# Patient Record
Sex: Female | Born: 1955 | Race: White | Hispanic: No | Marital: Married | Smoking: Never smoker
Health system: Southern US, Community
[De-identification: ages and names within clinical notes are randomized; demographics above are authoritative.]

## PROBLEM LIST (undated history)

## (undated) DIAGNOSIS — T8859XA Other complications of anesthesia, initial encounter: Secondary | ICD-10-CM

## (undated) DIAGNOSIS — M8580 Other specified disorders of bone density and structure, unspecified site: Secondary | ICD-10-CM

## (undated) DIAGNOSIS — R112 Nausea with vomiting, unspecified: Secondary | ICD-10-CM

## (undated) DIAGNOSIS — M1990 Unspecified osteoarthritis, unspecified site: Secondary | ICD-10-CM

## (undated) DIAGNOSIS — Z8489 Family history of other specified conditions: Secondary | ICD-10-CM

## (undated) DIAGNOSIS — N200 Calculus of kidney: Secondary | ICD-10-CM

## (undated) DIAGNOSIS — E785 Hyperlipidemia, unspecified: Secondary | ICD-10-CM

## (undated) DIAGNOSIS — R7303 Prediabetes: Secondary | ICD-10-CM

## (undated) DIAGNOSIS — Z9889 Other specified postprocedural states: Secondary | ICD-10-CM

## (undated) DIAGNOSIS — H269 Unspecified cataract: Secondary | ICD-10-CM

## (undated) DIAGNOSIS — T7840XA Allergy, unspecified, initial encounter: Secondary | ICD-10-CM

## (undated) HISTORY — PX: TONSILLECTOMY AND ADENOIDECTOMY: SUR1326

## (undated) HISTORY — DX: Unspecified cataract: H26.9

## (undated) HISTORY — PX: TOTAL HIP ARTHROPLASTY: SHX124

## (undated) HISTORY — PX: JOINT REPLACEMENT: SHX530

## (undated) HISTORY — PX: EXTRACORPOREAL SHOCK WAVE LITHOTRIPSY: SHX1557

## (undated) HISTORY — DX: Other specified disorders of bone density and structure, unspecified site: M85.80

## (undated) HISTORY — DX: Calculus of kidney: N20.0

## (undated) HISTORY — DX: Hyperlipidemia, unspecified: E78.5

## (undated) HISTORY — PX: WISDOM TOOTH EXTRACTION: SHX21

## (undated) HISTORY — DX: Unspecified osteoarthritis, unspecified site: M19.90

## (undated) HISTORY — DX: Allergy, unspecified, initial encounter: T78.40XA

---
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
## 1988-06-04 HISTORY — PX: CHOLECYSTECTOMY: SHX55

## 2004-03-31 ENCOUNTER — Ambulatory Visit: Payer: Self-pay | Admitting: Internal Medicine

## 2005-04-02 ENCOUNTER — Ambulatory Visit: Payer: Self-pay | Admitting: Internal Medicine

## 2006-07-22 ENCOUNTER — Ambulatory Visit: Payer: Self-pay | Admitting: Internal Medicine

## 2006-12-10 ENCOUNTER — Ambulatory Visit: Payer: Self-pay | Admitting: Gastroenterology

## 2006-12-12 ENCOUNTER — Ambulatory Visit: Payer: Self-pay | Admitting: Urology

## 2007-06-23 ENCOUNTER — Ambulatory Visit: Payer: Self-pay | Admitting: Urology

## 2007-10-16 ENCOUNTER — Ambulatory Visit: Payer: Self-pay | Admitting: Family Medicine

## 2007-11-17 ENCOUNTER — Ambulatory Visit: Payer: Self-pay | Admitting: Urology

## 2008-05-05 ENCOUNTER — Ambulatory Visit: Payer: Self-pay | Admitting: Urology

## 2008-10-19 ENCOUNTER — Ambulatory Visit: Payer: Self-pay | Admitting: Family Medicine

## 2008-11-24 ENCOUNTER — Ambulatory Visit: Payer: Self-pay | Admitting: Urology

## 2009-05-23 ENCOUNTER — Ambulatory Visit: Payer: Self-pay | Admitting: Urology

## 2009-11-16 ENCOUNTER — Ambulatory Visit: Payer: Self-pay | Admitting: Urology

## 2009-11-24 ENCOUNTER — Ambulatory Visit: Payer: Self-pay | Admitting: Urology

## 2009-12-08 ENCOUNTER — Ambulatory Visit: Payer: Self-pay | Admitting: Urology

## 2010-04-14 ENCOUNTER — Ambulatory Visit: Payer: Self-pay | Admitting: Urology

## 2010-12-28 ENCOUNTER — Ambulatory Visit: Payer: Self-pay | Admitting: Family Medicine

## 2011-01-15 ENCOUNTER — Ambulatory Visit: Payer: Self-pay | Admitting: Podiatry

## 2011-04-09 ENCOUNTER — Ambulatory Visit: Payer: Self-pay | Admitting: Urology

## 2011-06-24 IMAGING — CR DG ABDOMEN 1V
1 series · 2 of 2 positions shown · non-contrast
Comparison: none

REASON FOR EXAM: calculus of kidney
COMMENTS:

[Series 1: view not recorded · 0.17mm/px · 2 of 2 slices shown]
[im 1/2]
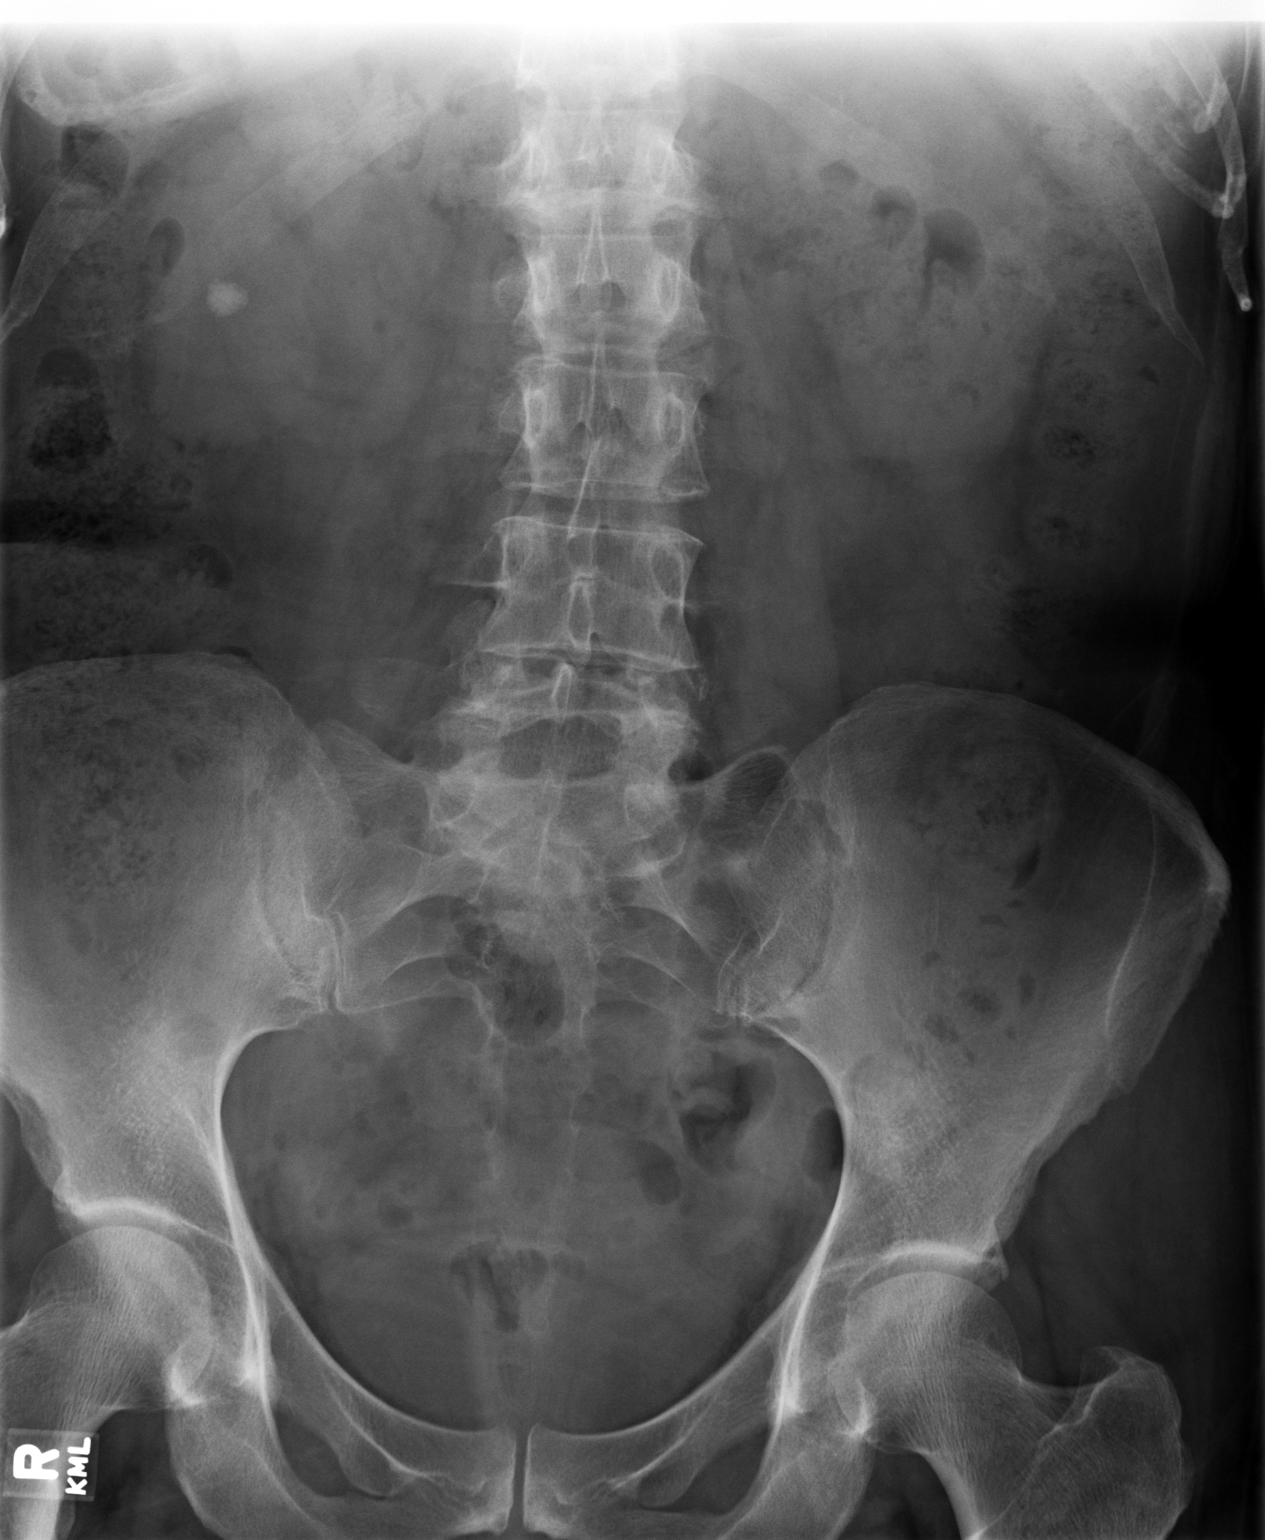
[im 2/2]
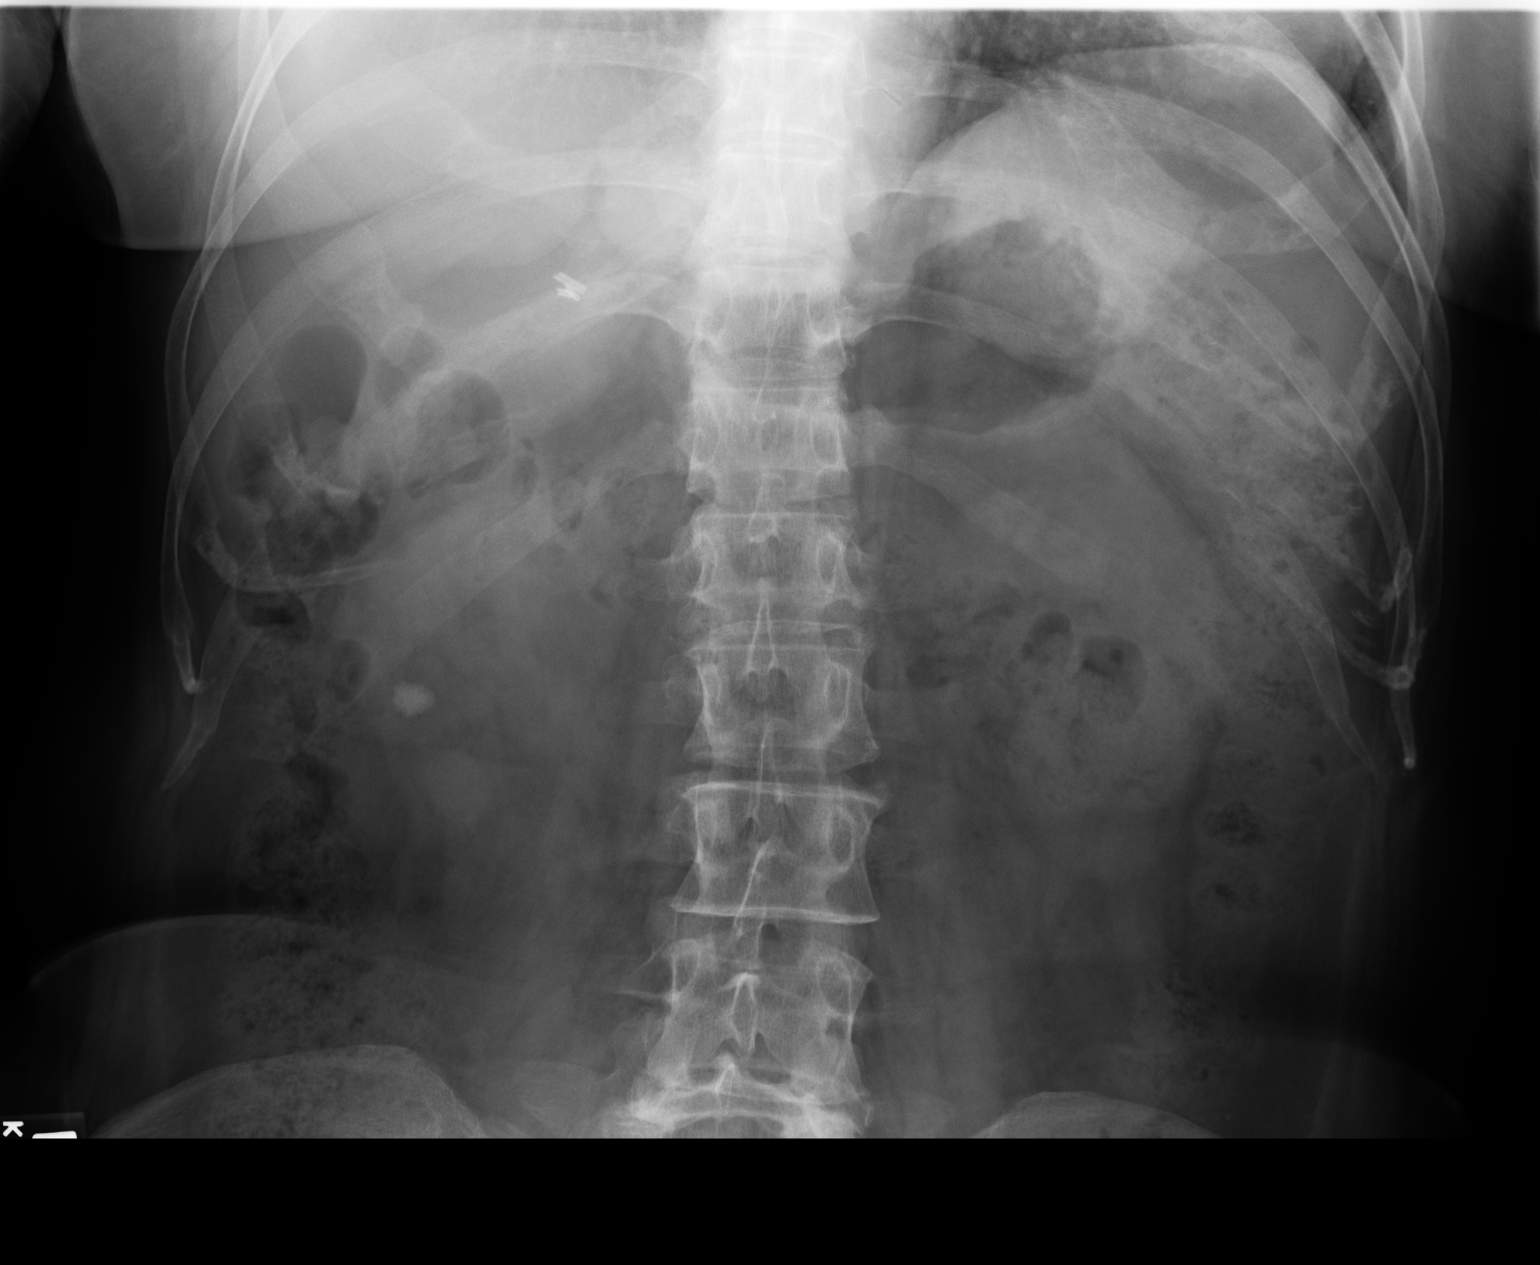

[2 of 2 positions shown; findings below may reference images not displayed]

PROCEDURE:     MDR - MDR KIDNEY URETER BLADDER  - May 23, 2009  [DATE]

RESULT:     Comparison is made to the prior exam 11/24/2008. There is again
noted an 11 mm calcification projected near the junction of the mid and
lower pole region of the right kidney. No left renal calcifications are
seen. No ureteral calcifications are observed on either side.
IMPRESSION: Right nephrolithiasis.

## 2012-04-09 ENCOUNTER — Ambulatory Visit: Payer: Self-pay | Admitting: Urology

## 2012-04-09 DIAGNOSIS — B3731 Acute candidiasis of vulva and vagina: Secondary | ICD-10-CM | POA: Insufficient documentation

## 2012-04-09 DIAGNOSIS — Z23 Encounter for immunization: Secondary | ICD-10-CM | POA: Insufficient documentation

## 2012-04-09 DIAGNOSIS — N3000 Acute cystitis without hematuria: Secondary | ICD-10-CM | POA: Insufficient documentation

## 2012-04-09 DIAGNOSIS — R339 Retention of urine, unspecified: Secondary | ICD-10-CM | POA: Insufficient documentation

## 2012-04-09 DIAGNOSIS — B373 Candidiasis of vulva and vagina: Secondary | ICD-10-CM | POA: Insufficient documentation

## 2012-11-06 ENCOUNTER — Ambulatory Visit: Payer: Self-pay | Admitting: Family Medicine

## 2013-04-14 ENCOUNTER — Ambulatory Visit: Payer: Self-pay | Admitting: Urology

## 2013-09-30 DIAGNOSIS — N200 Calculus of kidney: Secondary | ICD-10-CM | POA: Insufficient documentation

## 2013-09-30 DIAGNOSIS — E7800 Pure hypercholesterolemia, unspecified: Secondary | ICD-10-CM | POA: Insufficient documentation

## 2014-09-27 ENCOUNTER — Ambulatory Visit
Admit: 2014-09-27 | Disposition: A | Discharge status: Home / Self Care | Payer: Self-pay | Admitting: Nurse Practitioner

## 2015-08-04 ENCOUNTER — Other Ambulatory Visit: Payer: Self-pay | Admitting: Nurse Practitioner

## 2015-08-04 DIAGNOSIS — Z1231 Encounter for screening mammogram for malignant neoplasm of breast: Secondary | ICD-10-CM

## 2015-09-28 ENCOUNTER — Ambulatory Visit
Admission: RE | Admit: 2015-09-28 | Discharge: 2015-09-28 | Disposition: A | Discharge status: Home / Self Care | Payer: BC Managed Care – PPO | Source: Ambulatory Visit | Attending: Nurse Practitioner | Admitting: Nurse Practitioner

## 2015-09-28 DIAGNOSIS — Z1231 Encounter for screening mammogram for malignant neoplasm of breast: Secondary | ICD-10-CM | POA: Insufficient documentation

## 2017-08-07 ENCOUNTER — Other Ambulatory Visit: Payer: Self-pay | Admitting: Nurse Practitioner

## 2017-08-07 DIAGNOSIS — Z1231 Encounter for screening mammogram for malignant neoplasm of breast: Secondary | ICD-10-CM

## 2017-08-20 ENCOUNTER — Ambulatory Visit
Admission: RE | Admit: 2017-08-20 | Discharge: 2017-08-20 | Disposition: A | Discharge status: Home / Self Care | Payer: BC Managed Care – PPO | Source: Ambulatory Visit | Attending: Nurse Practitioner | Admitting: Nurse Practitioner

## 2017-08-20 DIAGNOSIS — Z1231 Encounter for screening mammogram for malignant neoplasm of breast: Secondary | ICD-10-CM | POA: Insufficient documentation

## 2018-09-09 ENCOUNTER — Ambulatory Visit: Payer: Self-pay | Admitting: Urology

## 2018-10-06 ENCOUNTER — Ambulatory Visit: Payer: Self-pay | Admitting: Urology

## 2018-11-25 ENCOUNTER — Ambulatory Visit: Payer: Self-pay | Admitting: Urology

## 2018-11-27 ENCOUNTER — Ambulatory Visit: Payer: Self-pay | Admitting: Urology

## 2018-12-19 ENCOUNTER — Ambulatory Visit: Payer: Self-pay | Admitting: Urology

## 2018-12-31 ENCOUNTER — Encounter: Payer: Self-pay | Admitting: Urology

## 2019-01-01 ENCOUNTER — Other Ambulatory Visit: Payer: Self-pay

## 2019-01-02 ENCOUNTER — Other Ambulatory Visit: Payer: Self-pay

## 2019-01-02 ENCOUNTER — Ambulatory Visit
Admission: RE | Admit: 2019-01-02 | Discharge: 2019-01-02 | Disposition: A | Discharge status: Home / Self Care | Payer: BC Managed Care – PPO | Source: Ambulatory Visit | Attending: Urology | Admitting: Urology

## 2019-01-02 ENCOUNTER — Encounter: Payer: Self-pay | Admitting: Urology

## 2019-01-02 ENCOUNTER — Ambulatory Visit: Payer: BC Managed Care – PPO | Admitting: Urology

## 2019-01-02 VITALS — BP 116/72 | HR 87 | Ht 69.0 in | Wt 190.0 lb

## 2019-01-02 DIAGNOSIS — N200 Calculus of kidney: Secondary | ICD-10-CM | POA: Diagnosis not present

## 2019-01-02 DIAGNOSIS — R3989 Other symptoms and signs involving the genitourinary system: Secondary | ICD-10-CM | POA: Diagnosis not present

## 2019-01-02 DIAGNOSIS — Z789 Other specified health status: Secondary | ICD-10-CM

## 2019-01-02 DIAGNOSIS — R339 Retention of urine, unspecified: Secondary | ICD-10-CM

## 2019-01-02 LAB — BLADDER SCAN AMB NON-IMAGING

## 2019-01-02 NOTE — Progress Notes (Signed)
01/02/2019 9:08 AM   [place] 1956/03/20 [id number]  Referring provider: Sallee Lange, NP [address] [place],  [place] [id number]  Chief Complaint  Patient presents with  . Establish Care    HPI: [place]ela [place] is a 63 y.o. female who presents to establish local urologic care.  She has most recently been followed by Dr. Jacqlyn Larsen at [facility] and last saw him in April 2019.  She has small bilateral renal calculi measuring 4 and 3 mm.  She has had 2 prior shockwave lithotripsy and an open ureterolithotomy in the late 70s.  She is on potassium citrate.  Since her last visit with Dr. Jacqlyn Larsen she has done well and denies flank, abdominal or pelvic pain.  She has no bothersome lower urinary tract symptoms.  Denies gross hematuria.   PMH: Hyperlipidemia  Surgical History: Cholecystectomy Shockwave lithotripsy x3 Tonsillectomy  Home Medications:  Allergies as of 01/02/2019      Reactions   Clindamycin Hives   Levofloxacin Hives   Tetracycline Hives   Lidocaine    Other reaction(s): Other (See Comments)   Penicillins Hives   Other reaction(s): UNKNOWN      Medication List       Accurate as of January 02, 2019  9:08 AM. If you have any questions, ask your nurse or doctor.        atorvastatin 10 MG tablet Commonly known as: LIPITOR Take 10 mg by mouth daily.   hydrochlorothiazide 50 MG tablet Commonly known as: HYDRODIURIL Take 50 mg by mouth daily.   potassium citrate 10 MEQ (1080 MG) SR tablet Commonly known as: UROCIT-K Take by mouth.       Allergies:  Allergies  Allergen Reactions  . Clindamycin Hives  . Levofloxacin Hives  . Tetracycline Hives  . Lidocaine     Other reaction(s): Other (See Comments)  . Penicillins Hives    Other reaction(s): UNKNOWN     Family History: No family history on file.  Social History:  has no history on file for tobacco, alcohol, and drug.  ROS: UROLOGY Frequent Urination?: No Hard to postpone urination?: No  Burning/pain with urination?: No Get up at night to urinate?: Yes Leakage of urine?: No Urine stream starts and stops?: No Trouble starting stream?: No Do you have to strain to urinate?: No Blood in urine?: No Urinary tract infection?: No Sexually transmitted disease?: No Injury to kidneys or bladder?: No Painful intercourse?: No Weak stream?: No Currently pregnant?: No Vaginal bleeding?: No Last menstrual period?: N/A  Gastrointestinal Nausea?: No Vomiting?: No Indigestion/heartburn?: No Diarrhea?: No Constipation?: No  Constitutional Fever: No Night sweats?: No Weight loss?: No Fatigue?: No  Skin Skin rash/lesions?: No Itching?: No  Eyes Blurred vision?: No Double vision?: No  Ears/Nose/Throat Sore throat?: No Sinus problems?: No  Hematologic/Lymphatic Swollen glands?: No Easy bruising?: No  Cardiovascular Leg swelling?: No Chest pain?: No  Respiratory Cough?: No Shortness of breath?: No  Endocrine Excessive thirst?: No  Musculoskeletal Back pain?: Yes Joint pain?: No  Neurological Headaches?: No Dizziness?: No  Psychologic Depression?: No Anxiety?: No  Physical Exam: BP 116/72 (BP Location: Left Arm, Patient Position: Sitting, Cuff Size: Normal)   Pulse 87   Ht 5\' 9"  (1.753 m)   Wt 190 lb (86.2 kg)   BMI 28.06 kg/m   Constitutional:  Alert and oriented, No acute distress. HEENT: [place] AT, moist mucus membranes.  Trachea midline, no masses. Cardiovascular: No clubbing, cyanosis, or edema. Respiratory: Normal respiratory effort, no  increased work of breathing. Skin: No rashes, bruises or suspicious lesions. Neurologic: Grossly intact, no focal deficits, moving all 4 extremities. Psychiatric: Normal mood and affect.   Assessment & Plan:    - Nephrolithiasis Asymptomatic, bilateral renal calculi.  A KUB was ordered and if stable she will continue annual follow-up.     Riki AltesScott C Alhaji Mcneal, MD  [facility] [address], [address] [place], [place] [address] [phone]

## 2019-01-02 NOTE — Patient Instructions (Signed)
Dietary Guidelines to Help Prevent Kidney Stones Kidney stones are deposits of minerals and salts that form inside your kidneys. Your risk of developing kidney stones may be greater depending on your diet, your lifestyle, the medicines you take, and whether you have certain medical conditions. Most people can reduce their chances of developing kidney stones by following the instructions below. Depending on your overall health and the type of kidney stones you tend to develop, your dietitian may give you more specific instructions. What are tips for following this plan? Reading food labels  Choose foods with "no salt added" or "low-salt" labels. Limit your sodium intake to less than 1500 mg per day.  Choose foods with calcium for each meal and snack. Try to eat about 300 mg of calcium at each meal. Foods that contain 200-500 mg of calcium per serving include: ? 8 oz (237 ml) of milk, fortified nondairy milk, and fortified fruit juice. ? 8 oz (237 ml) of kefir, yogurt, and soy yogurt. ? 4 oz (118 ml) of tofu. ? 1 oz of cheese. ? 1 cup (300 g) of dried figs. ? 1 cup (91 g) of cooked broccoli. ? 1-3 oz can of sardines or mackerel.  Most people need 1000 to 1500 mg of calcium each day. Talk to your dietitian about how much calcium is recommended for you. Shopping  Buy plenty of fresh fruits and vegetables. Most people do not need to avoid fruits and vegetables, even if they contain nutrients that may contribute to kidney stones.  When shopping for convenience foods, choose: ? Whole pieces of fruit. ? Premade salads with dressing on the side. ? Low-fat fruit and yogurt smoothies.  Avoid buying frozen meals or prepared deli foods.  Look for foods with live cultures, such as yogurt and kefir. Cooking  Do not add salt to food when cooking. Place a salt shaker on the table and allow each person to add his or her own salt to taste.  Use vegetable protein, such as beans, textured vegetable  protein (TVP), or tofu instead of meat in pasta, casseroles, and soups. Meal planning   Eat less salt, if told by your dietitian. To do this: ? Avoid eating processed or premade food. ? Avoid eating fast food.  Eat less animal protein, including cheese, meat, poultry, or fish, if told by your dietitian. To do this: ? Limit the number of times you have meat, poultry, fish, or cheese each week. Eat a diet free of meat at least 2 days a week. ? Eat only one serving each day of meat, poultry, fish, or seafood. ? When you prepare animal protein, cut pieces into small portion sizes. For most meat and fish, one serving is about the size of one deck of cards.  Eat at least 5 servings of fresh fruits and vegetables each day. To do this: ? Keep fruits and vegetables on hand for snacks. ? Eat 1 piece of fruit or a handful of berries with breakfast. ? Have a salad and fruit at lunch. ? Have two kinds of vegetables at dinner.  Limit foods that are high in a substance called oxalate. These include: ? Spinach. ? Rhubarb. ? Beets. ? Potato chips and french fries. ? Nuts.  If you regularly take a diuretic medicine, make sure to eat at least 1-2 fruits or vegetables high in potassium each day. These include: ? Avocado. ? Banana. ? Orange, prune, carrot, or tomato juice. ? Baked potato. ? Cabbage. ? Beans and split   peas. General instructions   Drink enough fluid to keep your urine clear or pale yellow. This is the most important thing you can do.  Talk to your health care provider and dietitian about taking daily supplements. Depending on your health and the cause of your kidney stones, you may be advised: ? Not to take supplements with vitamin C. ? To take a calcium supplement. ? To take a daily probiotic supplement. ? To take other supplements such as magnesium, fish oil, or vitamin B6.  Take all medicines and supplements as told by your health care provider.  Limit alcohol intake to no  more than 1 drink a day for nonpregnant women and 2 drinks a day for men. One drink equals 12 oz of beer, 5 oz of wine, or 1 oz of hard liquor.  Lose weight if told by your health care provider. Work with your dietitian to find strategies and an eating plan that works best for you. What foods are not recommended? Limit your intake of the following foods, or as told by your dietitian. Talk to your dietitian about specific foods you should avoid based on the type of kidney stones and your overall health. Grains Breads. Bagels. Rolls. Baked goods. Salted crackers. Cereal. Pasta. Vegetables Spinach. Rhubarb. Beets. Canned vegetables. Pickles. Olives. Meats and other protein foods Nuts. Nut butters. Large portions of meat, poultry, or fish. Salted or cured meats. Deli meats. Hot dogs. Sausages. Dairy Cheese. Beverages Regular soft drinks. Regular vegetable juice. Seasonings and other foods Seasoning blends with salt. Salad dressings. Canned soups. Soy sauce. Ketchup. Barbecue sauce. Canned pasta sauce. Casseroles. Pizza. Lasagna. Frozen meals. Potato chips. French fries. Summary  You can reduce your risk of kidney stones by making changes to your diet.  The most important thing you can do is drink enough fluid. You should drink enough fluid to keep your urine clear or pale yellow.  Ask your health care provider or dietitian how much protein from animal sources you should eat each day, and also how much salt and calcium you should have each day. This information is not intended to replace advice given to you by your health care provider. Make sure you discuss any questions you have with your health care provider. Document Released: 09/15/2010 Document Revised: 09/10/2018 Document Reviewed: 05/01/2016 Elsevier Patient Education  2020 [employer] Inc.  

## 2019-01-05 ENCOUNTER — Telehealth: Payer: Self-pay | Admitting: *Deleted

## 2019-01-05 NOTE — Telephone Encounter (Addendum)
Patient informed-verbalized understanding. Would like to know if she still needs to be taking Urocit and Hydrodiuril daily if her stones are stable and if so, can you refill it ?   ----- Message from Abbie Sons, MD sent at 01/04/2019 11:54 AM EDT ----- KUB shows stable, renal calculi.  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

## 2019-01-06 MED ORDER — POTASSIUM CITRATE ER 10 MEQ (1080 MG) PO TBCR: 10.0000 meq | EXTENDED_RELEASE_TABLET | Freq: Two times a day (BID) | ORAL | 3 refills | Status: DC

## 2019-01-06 MED ORDER — HYDROCHLOROTHIAZIDE 50 MG PO TABS: 50.0000 mg | ORAL_TABLET | Freq: Every day | ORAL | 3 refills | Status: DC

## 2019-01-06 NOTE — Telephone Encounter (Signed)
Would recommend continuing.  Prescriptions were sent

## 2019-01-07 NOTE — Telephone Encounter (Signed)
Patient informed verbalized understanding

## 2019-08-28 ENCOUNTER — Other Ambulatory Visit: Payer: Self-pay | Admitting: Nurse Practitioner

## 2019-08-28 DIAGNOSIS — Z1231 Encounter for screening mammogram for malignant neoplasm of breast: Secondary | ICD-10-CM

## 2019-09-07 ENCOUNTER — Ambulatory Visit
Admission: RE | Admit: 2019-09-07 | Discharge: 2019-09-07 | Disposition: A | Discharge status: Home / Self Care | Payer: Medicare PPO | Source: Ambulatory Visit | Attending: Nurse Practitioner | Admitting: Nurse Practitioner

## 2019-09-07 DIAGNOSIS — Z1231 Encounter for screening mammogram for malignant neoplasm of breast: Secondary | ICD-10-CM | POA: Diagnosis present

## 2020-01-04 ENCOUNTER — Ambulatory Visit: Payer: BC Managed Care – PPO | Admitting: Urology

## 2020-01-06 ENCOUNTER — Ambulatory Visit (INDEPENDENT_AMBULATORY_CARE_PROVIDER_SITE_OTHER): Payer: Medicare PPO | Admitting: Urology

## 2020-01-06 ENCOUNTER — Encounter: Payer: Self-pay | Admitting: Urology

## 2020-01-06 ENCOUNTER — Other Ambulatory Visit: Payer: Self-pay

## 2020-01-06 ENCOUNTER — Ambulatory Visit
Admission: RE | Admit: 2020-01-06 | Discharge: 2020-01-06 | Disposition: A | Discharge status: Home / Self Care | Payer: Medicare PPO | Attending: Urology | Admitting: Urology

## 2020-01-06 ENCOUNTER — Ambulatory Visit
Admission: RE | Admit: 2020-01-06 | Discharge: 2020-01-06 | Disposition: A | Discharge status: Home / Self Care | Payer: Medicare PPO | Source: Ambulatory Visit | Attending: Urology | Admitting: Urology

## 2020-01-06 VITALS — BP 119/78 | HR 71 | Ht 69.0 in | Wt 153.0 lb

## 2020-01-06 DIAGNOSIS — N200 Calculus of kidney: Secondary | ICD-10-CM | POA: Diagnosis not present

## 2020-01-06 NOTE — Progress Notes (Signed)
   01/06/2020 8:47 AM   [place] 1955-06-15 [id number]  Referring provider: Myrene Buddy, NP [address] [place],  [place] [address]  Chief Complaint  Patient presents with  . Other    Urologic history: 1.  Bilateral nephrolithiasis -Small, bilateral renal calculi -Prior ESWL x2 -Open ureterolithotomy late 1970s -On potassium citrate   HPI: 64 y.o. female presents for annual follow-up.   Since last years visit she has been doing well  Denies flank, abdominal, pelvic pain  Denies dysuria, gross hematuria  No bothersome LUTS  KUB performed last year with stable, bilateral renal calculi   PMH: No past medical history on file.  Surgical History: No past surgical history on file.  Home Medications:  Allergies as of 01/06/2020      Reactions   Clindamycin Hives   Levofloxacin Hives   Tetracycline Hives   Lidocaine    Other reaction(s): Other (See Comments)   Penicillins Hives   Other reaction(s): UNKNOWN      Medication List       Accurate as of January 06, 2020  8:47 AM. If you have any questions, ask your nurse or doctor.        atorvastatin 10 MG tablet Commonly known as: LIPITOR Take 10 mg by mouth daily.   clonazePAM 0.25 MG disintegrating tablet Commonly known as: KLONOPIN Take by mouth.   hydrochlorothiazide 50 MG tablet Commonly known as: HYDRODIURIL Take 1 tablet (50 mg total) by mouth daily.   potassium citrate 10 MEQ (1080 MG) SR tablet Commonly known as: UROCIT-K Take 1 tablet (10 mEq total) by mouth 2 (two) times daily with a meal.       Allergies:  Allergies  Allergen Reactions  . Clindamycin Hives  . Levofloxacin Hives  . Tetracycline Hives  . Lidocaine     Other reaction(s): Other (See Comments)  . Penicillins Hives    Other reaction(s): UNKNOWN     Family History: Family History  Problem Relation Age of Onset  . Breast cancer Neg Hx     Social History:  reports that she has never smoked. She has  never used smokeless tobacco. She reports current alcohol use. No history on file for drug use.   Physical Exam: BP 119/78   Pulse 71   Ht 5\' 9"  (1.753 m)   Wt 153 lb (69.4 kg)   BMI 22.59 kg/m   Constitutional:  Alert and oriented, No acute distress. HEENT: [place] AT, moist mucus membranes.  Trachea midline, no masses. Cardiovascular: No clubbing, cyanosis, or edema. Respiratory: Normal respiratory effort, no increased work of breathing. Skin: No rashes, bruises or suspicious lesions. Neurologic: Grossly intact, no focal deficits, moving all 4 extremities. Psychiatric: Normal mood and affect.    Assessment & Plan:    1.  Bilateral nephrolithiasis  Asymptomatic  KUB today, will call with results  Continue annual follow-up   , MD  [facility] [address] [place], [place] [phone]

## 2020-01-08 ENCOUNTER — Encounter: Payer: Self-pay | Admitting: Family Medicine

## 2020-01-18 ENCOUNTER — Other Ambulatory Visit: Payer: Self-pay | Admitting: Urology

## 2020-09-02 ENCOUNTER — Other Ambulatory Visit: Payer: Self-pay | Admitting: Nurse Practitioner

## 2020-09-02 DIAGNOSIS — Z1231 Encounter for screening mammogram for malignant neoplasm of breast: Secondary | ICD-10-CM

## 2020-09-02 LAB — BASIC METABOLIC PANEL
BUN: 20 (ref 4–21)
Creatinine: 0.7 (ref 0.5–1.1)

## 2020-09-02 LAB — HEPATIC FUNCTION PANEL
ALT: 22 (ref 7–35)
AST: 18 (ref 13–35)

## 2020-09-06 LAB — HM DEXA SCAN

## 2020-09-22 ENCOUNTER — Ambulatory Visit
Admission: RE | Admit: 2020-09-22 | Discharge: 2020-09-22 | Disposition: A | Discharge status: Home / Self Care | Payer: Medicare PPO | Source: Ambulatory Visit | Attending: Nurse Practitioner | Admitting: Nurse Practitioner

## 2020-09-22 ENCOUNTER — Other Ambulatory Visit: Payer: Self-pay

## 2020-09-22 DIAGNOSIS — Z1231 Encounter for screening mammogram for malignant neoplasm of breast: Secondary | ICD-10-CM | POA: Diagnosis present

## 2020-09-22 LAB — HM MAMMOGRAPHY

## 2020-09-22 LAB — LIPID PANEL
Cholesterol: 170 (ref 0–200)
HDL: 57 (ref 35–70)
LDL Cholesterol: 86
Triglycerides: 138 (ref 40–160)

## 2020-09-22 LAB — HEMOGLOBIN A1C: Hemoglobin A1C: 5.5

## 2020-11-11 ENCOUNTER — Encounter: Payer: Self-pay | Admitting: Emergency Medicine

## 2020-11-11 ENCOUNTER — Ambulatory Visit
Admission: EM | Admit: 2020-11-11 | Discharge: 2020-11-11 | Disposition: A | Discharge status: Home / Self Care | Payer: Medicare PPO | Attending: Emergency Medicine | Admitting: Emergency Medicine

## 2020-11-11 ENCOUNTER — Other Ambulatory Visit: Payer: Self-pay

## 2020-11-11 DIAGNOSIS — Z79899 Other long term (current) drug therapy: Secondary | ICD-10-CM | POA: Insufficient documentation

## 2020-11-11 DIAGNOSIS — E785 Hyperlipidemia, unspecified: Secondary | ICD-10-CM | POA: Insufficient documentation

## 2020-11-11 DIAGNOSIS — Z880 Allergy status to penicillin: Secondary | ICD-10-CM | POA: Insufficient documentation

## 2020-11-11 DIAGNOSIS — U071 COVID-19: Secondary | ICD-10-CM | POA: Diagnosis not present

## 2020-11-11 DIAGNOSIS — Z881 Allergy status to other antibiotic agents status: Secondary | ICD-10-CM | POA: Insufficient documentation

## 2020-11-11 DIAGNOSIS — B349 Viral infection, unspecified: Secondary | ICD-10-CM | POA: Diagnosis not present

## 2020-11-11 DIAGNOSIS — R5383 Other fatigue: Secondary | ICD-10-CM

## 2020-11-11 DIAGNOSIS — R059 Cough, unspecified: Secondary | ICD-10-CM

## 2020-11-11 NOTE — Discharge Instructions (Addendum)

## 2020-11-11 NOTE — ED Provider Notes (Signed)
[facility]    CSN: [id number] Arrival date & time: 11/11/20  1836      History   Chief Complaint Chief Complaint  Patient presents with   Cough    COVID+ home test    HPI Gabriela Mullins is a 65 y.o. female presenting for onset of fatigue, body aches, nasal congestion, sinus pressure and, cough yesterday.  Patient denies fever.  Temperature in clinic today is 99.5 degrees.  Patient says that she took an at home COVID test which was positive.  She says that she notified her PCP who advised her to go to urgent care to get "a real test."  Patient has been vaccinated for COVID-19 x2.  She denies any sick contacts and no known exposure to COVID-19.  Has taken Sudafed and Tylenol which she says did help her symptoms.  Patient says she believes she had a sinus infection until she tested positive for COVID.  She said it was definite positive and not a faint line. No chest pain, breathing difficulty, vomiting, diarrhea. Patient has no history of cardiopulmonary disease.  Medical history significant for hyperlipidemia.   HPI  History reviewed. No pertinent past medical history.  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 No pertinent surgical history.  OB History   No obstetric history on file.      Home Medications    Prior to Admission medications   Medication Sig Start Date End Date Taking? Authorizing Provider  atorvastatin (LIPITOR) 10 MG tablet Take 10 mg by mouth daily. 11/03/18  Yes [provider]  hydrochlorothiazide (HYDRODIURIL) 50 MG tablet Take 1 tablet (50 mg total) by mouth daily. 01/20/20  Yes Stoioff, Verna CzechScott C, MD   potassium citrate (UROCIT-K) 10 MEQ (1080 MG) SR tablet Take 1 tablet (10 mEq total) by mouth 2 (two) times daily with a meal. 01/06/19  Yes Stoioff, Verna CzechScott C, MD  clonazePAM (KLONOPIN) 0.25 MG disintegrating tablet Take by mouth. 08/28/19   [provider]    Family History Family History  Problem Relation Age of Onset   Breast cancer Neg Hx     Social History Social History   Tobacco Use   Smoking status: Never   Smokeless tobacco: Never  Vaping Use   Vaping Use: Never used  Substance Use Topics   Alcohol use: Yes     Allergies   Clindamycin, Levofloxacin, Tetracycline, Lidocaine, and Penicillins   Review of Systems Review of Systems  Constitutional:  Positive for fatigue. Negative for chills, diaphoresis and fever.  HENT:  Positive for congestion, rhinorrhea and sinus pressure. Negative for ear pain and sore throat.   Respiratory:  Positive for cough. Negative for shortness of breath.   Cardiovascular:  Negative for chest pain.  Gastrointestinal:  Negative for abdominal pain, nausea and vomiting.  Musculoskeletal:  Positive for myalgias.  Skin:  Negative for rash.  Neurological:  Negative for weakness and headaches.  Hematological:  Negative for adenopathy.    Physical Exam Triage Vital Signs ED Triage Vitals  Enc Vitals Group     BP 11/11/20 1859 (!) 151/94     Pulse Rate 11/11/20 1859 90     Resp 11/11/20 1859 14  Temp 11/11/20 1859 99.5 F (37.5 C)     Temp Source 11/11/20 1859 Oral     SpO2 11/11/20 1859 98 %     Weight 11/11/20 1856 162 lb (73.5 kg)     Height 11/11/20 1856 5\' 8"  (1.727 m)     Head Circumference --      Peak Flow --      Pain Score 11/11/20 1856 2     Pain Loc --      Pain Edu? --      Excl. in GC? --    No data found.  Updated Vital Signs BP (!) 151/94 (BP Location: Left Arm)   Pulse 90   Temp 99.5 F (37.5 C) (Oral)   Resp 14   Ht 5\' 8"  (1.727 m)   Wt 162 lb (73.5 kg)   SpO2 98%   BMI 24.63 kg/m        Physical Exam Vitals and nursing note reviewed.  Constitutional:      General: She is not in acute distress.    Appearance: Normal appearance. She is not ill-appearing or toxic-appearing.  HENT:     Head: Normocephalic and atraumatic.     Nose: Congestion and rhinorrhea present.     Mouth/Throat:     Mouth: Mucous membranes are moist.     Pharynx: Oropharynx is clear. Posterior oropharyngeal erythema (mild) present.  Eyes:     General: No scleral icterus.       Right eye: No discharge.        Left eye: No discharge.     Conjunctiva/sclera: Conjunctivae normal.  Cardiovascular:     Rate and Rhythm: Normal rate and regular rhythm.     Heart sounds: Normal heart sounds.  Pulmonary:     Effort: Pulmonary effort is normal. No respiratory distress.     Breath sounds: Normal breath sounds.  Musculoskeletal:     Cervical back: Neck supple.  Skin:    General: Skin is dry.  Neurological:     General: No focal deficit present.     Mental Status: She is alert. Mental status is at baseline.     Motor: No weakness.     Gait: Gait normal.  Psychiatric:        Mood and Affect: Mood normal.        Behavior: Behavior normal.        Thought Content: Thought content normal.     UC Treatments / Results  Labs (all labs ordered are listed, but only abnormal results are displayed) Labs Reviewed  SARS CORONAVIRUS 2 (TAT 6-24 HRS)    EKG   Radiology No results found.  Procedures Procedures (including critical care time)  Medications Ordered in UC Medications - No data to display  Initial Impression / Assessment and Plan / UC Course  I have reviewed the triage vital signs and the nursing notes.  Pertinent labs & imaging results that were available during my care of the patient were reviewed by me and considered in my medical decision making (see chart for details).  65 year old female presenting for PCR COVID test.  Patient states she had a positive at home test today.   Symptoms started yesterday.  Symptoms are fatigue, cough and congestion.  Patient vaccinated COVID-19 x2.  She is afebrile in the clinic today.  Oxygen is 98%.  She denies any red flag signs or symptoms.  Exam significant only for mild congestion.  Chest is clear to auscultation heart regular rate and  rhythm.  PCR COVID is obtained.  Current CDC guidelines, isolation protocol and ED precautions reviewed with patient.  Advised her that if she had a definite positive test at home and symptoms then it is likely she will test positive on the PCR test.  Her age of 29 is to qualify her for antiviral therapy if she test positive for COVID-19.  Advised her that someone will contact her and start her on that if she is still interested if she test positive.  Supportive care encouraged this time with increasing rest and fluids and isolating as well as continuing with her Sudafed and Tylenol.   Final Clinical Impressions(s) / UC Diagnoses   Final diagnoses:  Viral illness  Cough  Fatigue, unspecified type     Discharge Instructions      URI/COLD SYMPTOMS: Your exam today is consistent with a viral illness. Antibiotics are not indicated at this time. Use medications as directed, including cough syrup, nasal saline, and decongestants. Your symptoms should improve over the next few days and resolve within 7-10 days. Increase rest and fluids. F/u if symptoms worsen or predominate such as sore throat, ear pain, productive cough, shortness of breath, or if you develop high fevers or worsening fatigue over the next several days.    You have received COVID testing today either for positive exposure, concerning symptoms that could be related to COVID infection, screening purposes, or re-testing after confirmed positive.  Your test obtained today checks for active viral infection in the last 1-2 weeks. If your test is negative now, you can still test positive later. So, if you do develop symptoms you should either  get re-tested and/or isolate x 5 days and then strict mask use x 5 days (unvaccinated) or mask use x 10 days (vaccinated). Please follow CDC guidelines.  While Rapid antigen tests come back in 15-20 minutes, send out PCR/molecular test results typically come back within 1-3 days. In the mean time, if you are symptomatic, assume this could be a positive test and treat/monitor yourself as if you do have COVID.   We will call with test results if positive. Please download the MyChart app and set up a profile to access test results.   If symptomatic, go home and rest. Push fluids. Take Tylenol as needed for discomfort. Gargle warm salt water. Throat lozenges. Take Mucinex DM or Robitussin for cough. Humidifier in bedroom to ease coughing. Warm showers. Also review the COVID handout for more information.  COVID-19 INFECTION: The incubation period of COVID-19 is approximately 14 days after exposure, with most symptoms developing in roughly 4-5 days. Symptoms may range in severity from mild to critically severe. Roughly 80% of those infected will have mild symptoms. People of any age may become infected with COVID-19 and have the ability to transmit the virus. The most common symptoms include: fever, fatigue, cough, body aches, headaches, sore throat, nasal congestion, shortness of breath, nausea, vomiting, diarrhea, changes in smell and/or taste.    COURSE OF ILLNESS Some patients may begin with mild disease which can progress quickly into critical symptoms. If your symptoms are worsening please call ahead to the Emergency Department and proceed there for further treatment. Recovery time appears to be roughly 1-2 weeks for mild symptoms and 3-6 weeks for severe disease.   GO IMMEDIATELY TO ER FOR FEVER YOU ARE UNABLE TO GET DOWN WITH TYLENOL, BREATHING PROBLEMS, CHEST PAIN, FATIGUE, LETHARGY, INABILITY TO EAT OR DRINK, ETC  QUARANTINE AND ISOLATION: To help decrease the spread  of COVID-19 please remain  isolated if you have COVID infection or are highly suspected to have COVID infection. This means -stay home and isolate to one room in the home if you live with others. Do not share a bed or bathroom with others while ill, sanitize and wipe down all countertops and keep common areas clean and disinfected. Stay home for 5 days. If you have no symptoms or your symptoms are resolving after 5 days, you can leave your house. Continue to wear a mask around others for 5 additional days. If you have been in close contact (within 6 feet) of someone diagnosed with COVID 19, you are advised to quarantine in your home for 14 days as symptoms can develop anywhere from 2-14 days after exposure to the virus. If you develop symptoms, you  must isolate.  Most current guidelines for COVID after exposure -unvaccinated: isolate 5 days and strict mask use x 5 days. Test on day 5 is possible -vaccinated: wear mask x 10 days if symptoms do not develop -You do not necessarily need to be tested for COVID if you have + exposure and  develop symptoms. Just isolate at home x10 days from symptom onset During this global pandemic, CDC advises to practice social distancing, try to stay at least 45ft away from others at all times. Wear a face covering. Wash and sanitize your hands regularly and avoid going anywhere that is not necessary.  KEEP IN MIND THAT THE COVID TEST IS NOT 100% ACCURATE AND YOU SHOULD STILL DO EVERYTHING TO PREVENT POTENTIAL SPREAD OF VIRUS TO OTHERS (WEAR MASK, WEAR GLOVES, WASH HANDS AND SANITIZE REGULARLY). IF INITIAL TEST IS NEGATIVE, THIS MAY NOT MEAN YOU ARE DEFINITELY NEGATIVE. MOST ACCURATE TESTING IS DONE 5-7 DAYS AFTER EXPOSURE.   It is not advised by CDC to get re-tested after receiving a positive COVID test since you can still test positive for weeks to months after you have already cleared the virus.   *If you have not been vaccinated for COVID, I strongly suggest you consider getting vaccinated as  long as there are no contraindications.       ED Prescriptions   None    PDMP not reviewed this encounter.   Shirlee Latch, PA-C 11/11/20 1925

## 2020-11-11 NOTE — ED Triage Notes (Signed)
Patient c/o cough, sinus congestion and pressure, bodyaches that started on Thursday.  Patient took covid home test and was positive.

## 2020-11-12 ENCOUNTER — Telehealth: Payer: Self-pay | Admitting: Physician Assistant

## 2020-11-12 DIAGNOSIS — U071 COVID-19: Secondary | ICD-10-CM

## 2020-11-12 LAB — SARS CORONAVIRUS 2 (TAT 6-24 HRS): SARS Coronavirus 2: POSITIVE — AB

## 2020-11-12 MED ORDER — MOLNUPIRAVIR 200 MG PO CAPS: 800.0000 mg | ORAL_CAPSULE | Freq: Two times a day (BID) | ORAL | 0 refills | Status: AC

## 2020-11-12 NOTE — Telephone Encounter (Signed)
Call patient to notify her that her COVID test is positive.  This is not a surprise to her since she did have a positive test at home.  Patient does qualify for the antiviral medication given her age.  We will send Molnupiravir to [facility] at this time.  Advised her to start it as soon as possible.  CDC guidelines, isolation protocol and ED precautions regarding COVID-19 were reviewed with patient at her visit yesterday.

## 2021-01-05 ENCOUNTER — Ambulatory Visit: Payer: Self-pay | Admitting: Urology

## 2021-01-05 ENCOUNTER — Other Ambulatory Visit: Payer: Self-pay | Admitting: Family Medicine

## 2021-01-05 DIAGNOSIS — N200 Calculus of kidney: Secondary | ICD-10-CM

## 2021-01-06 ENCOUNTER — Ambulatory Visit
Admission: RE | Admit: 2021-01-06 | Discharge: 2021-01-06 | Disposition: A | Discharge status: Home / Self Care | Payer: Medicare PPO | Source: Ambulatory Visit | Attending: Urology | Admitting: Urology

## 2021-01-06 ENCOUNTER — Ambulatory Visit
Admission: RE | Admit: 2021-01-06 | Discharge: 2021-01-06 | Disposition: A | Discharge status: Home / Self Care | Payer: Medicare PPO | Attending: Urology | Admitting: Urology

## 2021-01-06 DIAGNOSIS — N200 Calculus of kidney: Secondary | ICD-10-CM

## 2021-01-09 ENCOUNTER — Other Ambulatory Visit: Payer: Self-pay

## 2021-01-09 ENCOUNTER — Encounter: Payer: Self-pay | Admitting: Urology

## 2021-01-09 ENCOUNTER — Ambulatory Visit: Payer: Medicare PPO | Admitting: Urology

## 2021-01-09 VITALS — BP 139/81 | HR 69 | Ht 68.0 in | Wt 170.0 lb

## 2021-01-09 DIAGNOSIS — N200 Calculus of kidney: Secondary | ICD-10-CM | POA: Diagnosis not present

## 2021-01-09 NOTE — Progress Notes (Signed)
   01/09/2021 7:10 AM   Gabriela Mullins Nov 24, 1955 [id number]  Referring provider: Myrene Buddy, NP [address] [place],  [place] [address]  Chief Complaint  Patient presents with   Follow-up    Urologic history: 1.  Bilateral nephrolithiasis -Small, bilateral renal calculi -Prior ESWL x2 -Open ureterolithotomy late 1970s -On potassium citrate   HPI: 65 y.o. female presents for annual follow-up.  Doing well since last years visit Denies flank, abdominal, pelvic pain/renal colic No bothersome LUTS KUB performed 01/06/2021 was reviewed and there are stable, bilateral renal calculi unchanged from prior KUB 01/06/2020   PMH: Hyperlipidemia  Surgical History: Cholecystectomy ESWL Ureterolithotomy  Home Medications:  Allergies as of 01/09/2021       Reactions   Clindamycin Hives   Levofloxacin Hives   Tetracycline Hives   Lidocaine    Other reaction(s): Other (See Comments)   Penicillins Hives   Other reaction(s): UNKNOWN        Medication List        Accurate as of January 09, 2021  7:10 AM. If you have any questions, ask your nurse or doctor.          STOP taking these medications    clonazePAM 0.25 MG disintegrating tablet Commonly known as: KLONOPIN       TAKE these medications    atorvastatin 10 MG tablet Commonly known as: LIPITOR Take 10 mg by mouth daily.   hydrochlorothiazide 50 MG tablet Commonly known as: HYDRODIURIL Take 1 tablet (50 mg total) by mouth daily.   potassium citrate 10 MEQ (1080 MG) SR tablet Commonly known as: UROCIT-K Take 1 tablet (10 mEq total) by mouth 2 (two) times daily with a meal.        Allergies:  Allergies  Allergen Reactions   Clindamycin Hives   Levofloxacin Hives   Tetracycline Hives   Lidocaine     Other reaction(s): Other (See Comments)   Penicillins Hives    Other reaction(s): UNKNOWN     Family History: Family History  Problem Relation Age of Onset   Breast cancer Neg Hx      Social History:  reports that she has never smoked. She has never used smokeless tobacco. She reports current alcohol use. No history on file for drug use.   Physical Exam: There were no vitals taken for this visit.  Constitutional:  Alert and oriented, No acute distress. HEENT:  AT, moist mucus membranes.  Trachea midline, no masses. Cardiovascular: No clubbing, cyanosis, or edema. Respiratory: Normal respiratory effort, no increased work of breathing.   Pertinent Imaging: KUB images were personally reviewed and interpreted   Abdomen 1 view (KUB)  Narrative CLINICAL DATA:  History of bilateral kidney stones.  EXAM: ABDOMEN - 1 VIEW  COMPARISON:  Abdominal radiograph dated 01/06/2020.  FINDINGS: The bowel gas pattern is normal. There are 5 mm calculi overlying both kidneys appear unchanged since 01/06/2020. Mild degenerative changes are seen in the spine and hips.  IMPRESSION: Bilateral renal calculi measuring up to 5 mm are not significantly changed since 2021.   Electronically Signed By: Romona Curls M.D. On: 01/08/2021 13:27   Assessment & Plan:    1.Bilateral nephrolithiasis Stable, bilateral renal calculi Continue annual follow-up   Riki Altes, MD  [facility] [address] [place], [place] [address] [phone]

## 2021-01-10 ENCOUNTER — Other Ambulatory Visit: Payer: Self-pay | Admitting: Family Medicine

## 2021-01-10 DIAGNOSIS — N200 Calculus of kidney: Secondary | ICD-10-CM

## 2021-03-29 ENCOUNTER — Encounter: Payer: Self-pay | Admitting: Internal Medicine

## 2021-03-29 ENCOUNTER — Ambulatory Visit
Admission: RE | Admit: 2021-03-29 | Discharge: 2021-03-29 | Disposition: A | Discharge status: Home / Self Care | Payer: Medicare PPO | Attending: Internal Medicine | Admitting: Internal Medicine

## 2021-03-29 ENCOUNTER — Ambulatory Visit: Payer: Medicare PPO | Admitting: Internal Medicine

## 2021-03-29 ENCOUNTER — Ambulatory Visit
Admission: RE | Admit: 2021-03-29 | Discharge: 2021-03-29 | Disposition: A | Discharge status: Home / Self Care | Payer: Medicare PPO | Source: Ambulatory Visit | Attending: Internal Medicine | Admitting: Internal Medicine

## 2021-03-29 ENCOUNTER — Other Ambulatory Visit: Payer: Self-pay

## 2021-03-29 VITALS — BP 120/82 | HR 80 | Temp 98.1°F | Ht 68.0 in | Wt 179.0 lb

## 2021-03-29 DIAGNOSIS — Z23 Encounter for immunization: Secondary | ICD-10-CM

## 2021-03-29 DIAGNOSIS — M160 Bilateral primary osteoarthritis of hip: Secondary | ICD-10-CM

## 2021-03-29 DIAGNOSIS — E7800 Pure hypercholesterolemia, unspecified: Secondary | ICD-10-CM

## 2021-03-29 DIAGNOSIS — R7303 Prediabetes: Secondary | ICD-10-CM | POA: Insufficient documentation

## 2021-03-29 DIAGNOSIS — N200 Calculus of kidney: Secondary | ICD-10-CM | POA: Diagnosis not present

## 2021-03-29 DIAGNOSIS — M8580 Other specified disorders of bone density and structure, unspecified site: Secondary | ICD-10-CM | POA: Insufficient documentation

## 2021-03-29 MED ORDER — MELOXICAM 15 MG PO TABS: 15.0000 mg | ORAL_TABLET | Freq: Every day | ORAL | 0 refills | Status: DC

## 2021-03-29 NOTE — Progress Notes (Signed)
Date:  03/29/2021   Name:  Gabriela Mullins   DOB:  Sep 22, 1955   MRN:  [id number]   Chief Complaint: New Patient (Initial Visit), Establish Care (Former PCP Lenon Oms, NP), and Hip Pain (Left; x2 weeks; no known injury or trauma; worse with prolonged sitting; 3/10 pain)  Hip Pain  The incident occurred more than 1 week ago. Incident location: walking out of a restaurant. There was no injury mechanism. The pain is present in the left hip. The quality of the pain is described as aching and shooting. The pain is at a severity of 3/10. The pain is mild. The pain has been Fluctuating since onset. Pertinent negatives include no inability to bear weight, loss of motion, numbness or tingling. The symptoms are aggravated by weight bearing. She has tried acetaminophen (intermittent tylenol) for the symptoms.  Hyperlipidemia This is a chronic problem. The problem is controlled. Pertinent negatives include no chest pain. Current antihyperlipidemic treatment includes statins. The current treatment provides significant improvement of lipids. There are no compliance problems.    Lab Results  Component Value Date   CREATININE 0.7 09/02/2020   BUN 20 09/02/2020   Lab Results  Component Value Date   CHOL 170 09/22/2020   HDL 57 09/22/2020   LDLCALC 86 09/22/2020   TRIG 138 09/22/2020   No results found for: TSH Lab Results  Component Value Date   HGBA1C 5.5 09/22/2020   No results found for: WBC, HGB, HCT, MCV, PLT Lab Results  Component Value Date   ALT 22 09/02/2020   AST 18 09/02/2020     Review of Systems  Constitutional:  Negative for fatigue and unexpected weight change.  Eyes:  Negative for visual disturbance.  Respiratory:  Negative for chest tightness and wheezing.   Cardiovascular:  Negative for chest pain and palpitations.  Gastrointestinal:  Negative for abdominal pain, constipation and diarrhea.  Musculoskeletal:  Positive for arthralgias and gait problem.  Neurological:   Negative for tingling, weakness, numbness and headaches.  Psychiatric/Behavioral:  Negative for dysphoric mood and sleep disturbance. The patient is not nervous/anxious.    Patient Active Problem List   Diagnosis Date Noted   Prediabetes 03/29/2021   Osteopenia determined by x-ray 03/29/2021   Hypercholesterolemia 09/30/2013   Nephrolithiasis 09/30/2013   Double voiding 04/15/2013   Incomplete emptying of bladder 04/09/2012    Allergies  Allergen Reactions   Clindamycin Hives   Levofloxacin Hives   Lidocaine Hives   Penicillins Hives   Tetracycline Hives    Past Surgical History:  Procedure Laterality Date   CHOLECYSTECTOMY  1990   EXTRACORPOREAL SHOCK WAVE LITHOTRIPSY     TONSILLECTOMY AND ADENOIDECTOMY     URETEROLITHOTOMY  1970   WISDOM TOOTH EXTRACTION Bilateral     Social History   Tobacco Use   Smoking status: Never   Smokeless tobacco: Never  Vaping Use   Vaping Use: Never used  Substance Use Topics   Alcohol use: Not Currently   Drug use: Never     Medication list has been reviewed and updated.  Current Meds  Medication Sig   atorvastatin (LIPITOR) 10 MG tablet Take 10 mg by mouth daily.   Ferrous Sulfate (IRON PO) Take 1 tablet by mouth.   hydrochlorothiazide (HYDRODIURIL) 50 MG tablet Take 1 tablet (50 mg total) by mouth daily.   meloxicam (MOBIC) 15 MG tablet Take 1 tablet (15 mg total) by mouth daily.   potassium citrate (UROCIT-K) 10 MEQ (1080 MG) SR tablet  Take 1 tablet (10 mEq total) by mouth 2 (two) times daily with a meal.    PHQ 2/9 Scores 03/29/2021  PHQ - 2 Score 0  PHQ- 9 Score 0    GAD 7 : Generalized Anxiety Score 03/29/2021  Nervous, Anxious, on Edge 0  Control/stop worrying 0  Worry too much - different things 0  Trouble relaxing 0  Restless 0  Easily annoyed or irritable 0  Afraid - awful might happen 0  Total GAD 7 Score 0  Anxiety Difficulty Not difficult at all    BP Readings from Last 3 Encounters:  03/29/21 120/82   01/09/21 139/81  11/11/20 (!) 151/94    Physical Exam Vitals and nursing note reviewed.  Constitutional:      General: She is not in acute distress.    Appearance: Normal appearance. She is well-developed.  HENT:     Head: Normocephalic and atraumatic.  Neck:     Vascular: No carotid bruit.  Cardiovascular:     Rate and Rhythm: Normal rate and regular rhythm.     Pulses: Normal pulses.  Pulmonary:     Effort: Pulmonary effort is normal. No respiratory distress.     Breath sounds: No wheezing or rhonchi.  Musculoskeletal:     Cervical back: Normal range of motion.     Right hip: No tenderness or bony tenderness. Normal range of motion.     Left hip: Tenderness and bony tenderness present. Decreased range of motion.     Right lower leg: No edema.     Left lower leg: No edema.  Lymphadenopathy:     Cervical: No cervical adenopathy.  Skin:    General: Skin is warm and dry.     Findings: No rash.  Neurological:     Mental Status: She is alert and oriented to person, place, and time.     Deep Tendon Reflexes:     Reflex Scores:      Patellar reflexes are 2+ on the right side and 2+ on the left side. Psychiatric:        Mood and Affect: Mood normal.        Behavior: Behavior normal.    Wt Readings from Last 3 Encounters:  03/29/21 179 lb (81.2 kg)  01/09/21 170 lb (77.1 kg)  11/11/20 162 lb (73.5 kg)    BP 120/82 (BP Location: Right Arm, Patient Position: Sitting, Cuff Size: Normal)   Pulse 80   Temp 98.1 F (36.7 C) (Oral)   Ht 5\' 8"  (1.727 m)   Wt 179 lb (81.2 kg)   SpO2 96%   BMI 27.22 kg/m   Assessment and Plan: 1. Primary osteoarthritis of both hips KUB suggests scoliosis and OA both hips Will get dedicated imaging and start Mobic daily Consider referral depending on imaging results - meloxicam (MOBIC) 15 MG tablet; Take 1 tablet (15 mg total) by mouth daily.  Dispense: 30 tablet; Refill: 0 - DG Hip Unilat W OR W/O Pelvis 2-3 Views Left  2. Need for  vaccination for pneumococcus - Pneumococcal conjugate vaccine 20-valent  3. Hypercholesterolemia Tolerating statin medication without side effects at this time Continue same therapy without change at this time.    Partially dictated using . Any errors are unintentional.  Animal nutritionist, MD [facility]  03/29/2021

## 2021-04-24 ENCOUNTER — Other Ambulatory Visit: Payer: Self-pay | Admitting: Internal Medicine

## 2021-04-24 DIAGNOSIS — M160 Bilateral primary osteoarthritis of hip: Secondary | ICD-10-CM

## 2021-04-24 NOTE — Telephone Encounter (Signed)
Requested medications are due for refill today in 5 days  Requested medications are on the active medication list yes  Last refill 03/29/21  Last visit 03/29/21  Future visit scheduled 09/06/21  Notes to clinic Unclear if med was to be continued, only ordered for 30 day supply with orders for scans/xrays. Please assess. Requested Prescriptions  Pending Prescriptions Disp Refills   meloxicam (MOBIC) 15 MG tablet [Pharmacy Med Name: Meloxicam 15 MG Oral Tablet] 30 tablet 0    Sig: Take 1 tablet by mouth once daily     Analgesics:  COX2 Inhibitors Failed - 04/24/2021  4:40 PM      Failed - HGB in normal range and within 360 days    No results found for: HGB, HGBKUC, HGBPOCKUC, HGBOTHER, TOTHGB, HGBPLASMA        Passed - Cr in normal range and within 360 days    Creatinine  Date Value Ref Range Status  09/02/2020 0.7 0.5 - 1.1 Final          Passed - Patient is not pregnant      Passed - Valid encounter within last 12 months    Recent Outpatient Visits           3 weeks ago Primary osteoarthritis of both hips   [facility] Reubin Milan, MD       Future Appointments             In 4 months Judithann Graves, Nyoka Cowden, MD [facility]   In 8 months Stoioff, Verna Czech, MD [facility]

## 2021-04-26 ENCOUNTER — Other Ambulatory Visit: Payer: Self-pay | Admitting: Internal Medicine

## 2021-04-26 DIAGNOSIS — M160 Bilateral primary osteoarthritis of hip: Secondary | ICD-10-CM

## 2021-04-26 NOTE — Telephone Encounter (Signed)
Rx sent to pharmacy by PCP office yesterday Requested Prescriptions  Refused Prescriptions Disp Refills  . meloxicam (MOBIC) 15 MG tablet [Pharmacy Med Name: Meloxicam 15 MG Oral Tablet] 30 tablet 0    Sig: Take 1 tablet by mouth once daily     Analgesics:  COX2 Inhibitors Failed - 04/26/2021  5:30 AM      Failed - HGB in normal range and within 360 days    No results found for: HGB, HGBKUC, HGBPOCKUC, HGBOTHER, TOTHGB, HGBPLASMA       Passed - Cr in normal range and within 360 days    Creatinine  Date Value Ref Range Status  09/02/2020 0.7 0.5 - 1.1 Final         Passed - Patient is not pregnant      Passed - Valid encounter within last 12 months    Recent Outpatient Visits          4 weeks ago Primary osteoarthritis of both hips   [facility] Reubin Milan, MD      Future Appointments            In 4 months Judithann Graves, Nyoka Cowden, MD [facility]   In 8 months Stoioff, Verna Czech, MD [facility]

## 2021-05-12 ENCOUNTER — Encounter: Payer: Self-pay | Admitting: Internal Medicine

## 2021-06-01 ENCOUNTER — Other Ambulatory Visit: Payer: Self-pay | Admitting: Internal Medicine

## 2021-06-01 DIAGNOSIS — M160 Bilateral primary osteoarthritis of hip: Secondary | ICD-10-CM

## 2021-06-01 MED ORDER — MELOXICAM 15 MG PO TABS: 15.0000 mg | ORAL_TABLET | Freq: Every day | ORAL | 0 refills | Status: DC

## 2021-06-23 ENCOUNTER — Encounter: Payer: Self-pay | Admitting: Internal Medicine

## 2021-06-27 ENCOUNTER — Other Ambulatory Visit: Payer: Self-pay

## 2021-06-27 ENCOUNTER — Encounter: Payer: Self-pay | Admitting: Family Medicine

## 2021-06-27 ENCOUNTER — Ambulatory Visit: Payer: Medicare PPO | Admitting: Family Medicine

## 2021-06-27 VITALS — BP 112/78 | HR 78 | Ht 68.0 in | Wt 180.0 lb

## 2021-06-27 DIAGNOSIS — M25552 Pain in left hip: Secondary | ICD-10-CM | POA: Diagnosis not present

## 2021-06-27 DIAGNOSIS — M1612 Unilateral primary osteoarthritis, left hip: Secondary | ICD-10-CM | POA: Diagnosis not present

## 2021-06-27 MED ORDER — MELOXICAM 15 MG PO TABS: 15.0000 mg | ORAL_TABLET | Freq: Every day | ORAL | 0 refills | Status: DC | PRN

## 2021-06-27 NOTE — Assessment & Plan Note (Signed)
Patient with roughly 2-year history of left primarily lateral hip pain with intermittent episodes of anterior pain over the same time frame.  Symptoms were atraumatic onset, progressively worsened over the past few months, denies any specific change in activity at that time.  She has noted intermittent radiation to the knee, denies any overt paresthesias, weakness, pain noted with weightbearing activities and laying on her left side, symptoms have disrupted sleep.  She does have known chronic low back issues.  Examination reveals focal tenderness at the greater trochanter, this recreates her stated symptomatology, significantly deconditioned and tight structures throughout bilateral hips, FABER and FADIR testing limited though FADIR does recreate anterior hip symptoms.  Negative straight leg raise bilaterally.  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  I have reviewed the same with the patient as well as treatment strategies given her initial benefit from meloxicam followed by gradual recurrence of pain despite meloxicam dosing.  A formal referral to physical therapy was written for, and new Rx for meloxicam with guidelines for as needed dosing advised, and close follow-up for reevaluation of symptoms and to determine the need for ultrasound-guided injections at the greater trochanter and/or intra-articular left hip.

## 2021-06-27 NOTE — Assessment & Plan Note (Signed)
Patient with roughly 2 years of atraumatic left primarily lateral hip pain with associated anterior involvement.  Radiation to the left knee, no paresthesias, no specific overuse at onset.  Pertinent examination findings for this condition include tenderness at the greater trochanteric on the left, negative straight leg raise, positive FADIR, limited by tightness and deconditioning throughout the hips.  Her x-rays reveal severe osteoarthritis of the left hip, moderate on the right, and her symptoms are consistent with osteoarthritis related arthralgia of the left hip.  Treatment strategies outlined and we will perform medication management with transition of meloxicam to as needed dosing, start formal physical therapy with referral provided, and maintain close follow-up to determine need for intra-articular corticosteroid.

## 2021-06-27 NOTE — Progress Notes (Signed)
Primary Care / Sports Medicine Office Visit  Patient Information:  Patient ID: Gabriela Mullins, female DOB: March 15, 1956 Age: 66 y.o. MRN: [id number]   Gabriela Mullins is a pleasant 66 y.o. female presenting with the following:  Chief Complaint  Patient presents with   Hip Pain    Left; X-Ray 03/29/21 determining bilateral hip osteoarthritis; treatments include meloxicam and Tylenol; describes pain as throbbing, intermittent, sharp, radiating to knee; 4/10 pain    Vitals:   06/27/21 0850  BP: 112/78  Pulse: 78  SpO2: 95%   Vitals:   06/27/21 0850  Weight: 180 lb (81.6 kg)  Height: 5\' 8"  (1.727 m)   Body mass index is 27.37 kg/m.  No results found.   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  Symptoms were atraumatic onset, progressively worsened over the past few months, denies any specific change in activity at that time.  She has noted intermittent radiation to the knee, denies any overt paresthesias, weakness, pain noted with weightbearing activities and laying on her left side, symptoms have disrupted sleep.  She does have known chronic low back issues.  Examination reveals focal tenderness at the greater trochanter, this recreates her stated symptomatology, significantly deconditioned and tight structures throughout bilateral hips, FABER and FADIR testing  limited though FADIR does recreate anterior hip symptoms.  Negative straight leg raise bilaterally.  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  I have reviewed the same with the patient as well as treatment strategies given her initial benefit from meloxicam followed by gradual recurrence of pain despite meloxicam dosing.  A formal referral to physical therapy was written for, and new Rx for meloxicam with guidelines for as needed dosing advised, and close follow-up for reevaluation of symptoms and to determine the need for ultrasound-guided injections at the greater trochanter and/or intra-articular left hip.  Primary osteoarthritis of left hip Patient with roughly 2 years of atraumatic left primarily lateral hip pain with associated anterior involvement.  Radiation to the left knee, no paresthesias, no specific overuse at onset.  Pertinent examination findings for this condition include tenderness at the greater trochanteric on the left, negative straight leg raise, positive FADIR, limited by tightness and deconditioning throughout the hips.  Her x-rays reveal severe osteoarthritis of the left hip, moderate on the right, and her symptoms are consistent with osteoarthritis related arthralgia of the left hip.  Treatment strategies outlined and we will perform medication management with transition of meloxicam to as needed dosing, start formal physical therapy with referral provided, and maintain close follow-up to determine need for intra-articular corticosteroid.   Orders & Medications Meds ordered this encounter  Medications   meloxicam (MOBIC) 15 MG tablet    Sig: Take 1 tablet (15 mg total) by mouth daily as needed for pain.    Dispense:  90 tablet  Refill:  0   No orders of the defined types were placed in this encounter.    Return for Next  available 40 minutes.     Jerrol Banana, MD   Primary Care Sports Medicine [facility]

## 2021-06-27 NOTE — Patient Instructions (Signed)
-   Transition to as needed dosing of meloxicam (take with food) - Can use Tylenol (acetaminophen) for additional pain control if needed, avoid any additional NSAIDs (ibuprofen, naproxen) - Referral coordinator will contact you for physical therapy scheduling - Contact gentle activity as tolerated using hip symptoms as a guide between now and follow-up - Return for follow-up as scheduled

## 2021-06-29 ENCOUNTER — Encounter: Payer: Self-pay | Admitting: Family Medicine

## 2021-06-29 ENCOUNTER — Other Ambulatory Visit: Payer: Self-pay

## 2021-06-29 ENCOUNTER — Inpatient Hospital Stay (INDEPENDENT_AMBULATORY_CARE_PROVIDER_SITE_OTHER): Payer: Medicare PPO | Admitting: Radiology

## 2021-06-29 ENCOUNTER — Ambulatory Visit (INDEPENDENT_AMBULATORY_CARE_PROVIDER_SITE_OTHER): Payer: Medicare PPO | Admitting: Family Medicine

## 2021-06-29 VITALS — BP 108/82 | HR 86 | Ht 68.0 in | Wt 179.0 lb

## 2021-06-29 DIAGNOSIS — M25552 Pain in left hip: Secondary | ICD-10-CM

## 2021-06-29 DIAGNOSIS — M1612 Unilateral primary osteoarthritis, left hip: Secondary | ICD-10-CM | POA: Diagnosis not present

## 2021-06-29 MED ORDER — TRIAMCINOLONE ACETONIDE 40 MG/ML IJ SUSP
80.0000 mg | Freq: Once | INTRAMUSCULAR | Status: AC
  Administered 2021-06-29: 80 mg

## 2021-06-29 NOTE — Progress Notes (Signed)
°  ° °  Primary Care / Sports Medicine Office Visit  Patient Information:  Patient ID: Gabriela Mullins, female DOB: 01/06/1956 Age: 66 y.o. MRN: [id number]   Gabriela Mullins is a pleasant 66 y.o. female presenting with the following:  Chief Complaint  Patient presents with   Injections    Greater trochanteric pain syndrome of left lower extremity and left hip joint pain from underlying osteoarthritis; 4/10 pain    Vitals:   06/29/21 0925  BP: 108/82  Pulse: 86  SpO2: 98%   Vitals:   06/29/21 0925  Weight: 179 lb (81.2 kg)  Height: 5\' 8"  (1.727 m)   Body mass index is 27.22 kg/m.  No results found.   Independent interpretation of notes and tests performed by another provider:   None  Procedures performed:   Procedure:  Injection of Left hip joint under ultrasound guidance. Ultrasound guidance Utilized for in planea pproach,Joint with apparent physiologic fluid, hypoechoic response from injectate noted Samsung HS60 device utilized with permanent recording / reporting. Verbal informed consent obtained and verified. Skin prepped in a sterile fashion. Ethyl chloride for topical local analgesia.  Completed without difficulty and tolerated well. Medication: triamcinolone acetonide 40 mg/mL suspension for injection 1 mL total and 2 mL lidocaine 1% without epinephrine utilized for needle placement anesthetic Advised to contact for fevers/chills, erythema, induration, drainage, or persistent bleeding.  Procedure:  Injection of left hip greater trochanteric bursa under ultrasound guidance. Ultrasound guidance utilized for out of plane approach, greater trochanter seen, no cortical abnormalities noted, hypoechoic tissue superficial to the greater trochanter most likely consistent with tendinosis of the overlying tendons  Samsung HS60 device utilized with permanent recording / reporting. Verbal informed consent obtained and verified. Skin prepped in a sterile fashion. Ethyl  chloride for topical local analgesia.  Completed without difficulty and tolerated well. Medication: triamcinolone acetonide 40 mg/mL suspension for injection 1 mL total and 2 mL lidocaine 1% without epinephrine utilized for needle placement anesthetic Advised to contact for fevers/chills, erythema, induration, drainage, or persistent bleeding.   Pertinent History, Exam, Impression, and Recommendations:   Primary osteoarthritis of left hip Patient presents for scheduled left hip intra-articular injection, tolerated well, post care reviewed.  She can dose meloxicam that was previously prescribed on an as-needed basis until symptoms respond to cortisone.  Additionally, we have written for physical therapy at the last visit for her to start once weekly.  I have advised her to contact her office for any lingering symptoms after therapy course has finished.  She can otherwise follow-up as needed.  Greater trochanteric pain syndrome of left lower extremity Patient presents for scheduled cortisone injection into the left hip greater trochanter.  Patient tolerated the procedure with pain, post care reviewed. See additional assessment(s) for plan details.   Orders & Medications No orders of the defined types were placed in this encounter.  Orders Placed This Encounter  Procedures   [place] LIMITED JOINT SPACE STRUCTURES LOW LEFT     No follow-ups on file.     Gabriela Culver, MD   Primary Care Sports Medicine [place]

## 2021-06-29 NOTE — Addendum Note (Signed)
Addended by: Forbes Cellar on: 06/29/2021 09:56 AM   Modules accepted: Orders

## 2021-06-29 NOTE — Patient Instructions (Addendum)
You have just been given a cortisone injection to reduce pain and inflammation. After the injection you may notice immediate relief of pain as a result of the Lidocaine. It is important to rest the area of the injection for 24 to 48 hours after the injection. There is a possibility of some temporary increased discomfort and swelling for up to 72 hours until the cortisone begins to work. If you do have pain, simply rest the joint and use ice. If you can tolerate over the counter medications, you can try Tylenol, Aleve, or Advil for added relief per package instructions. - As above, relative rest for the next 2 days and then gradual return to full activity - Can dose over-the-counter medications for pain on as-needed basis OR meloxicam once daily until symptoms respond to cortisone -Start physical therapy once scheduled, contact her office for any lingering symptoms after therapy complete

## 2021-06-29 NOTE — Assessment & Plan Note (Signed)
Patient presents for scheduled cortisone injection into the left hip greater trochanter.  Patient tolerated the procedure with pain, post care reviewed. See additional assessment(s) for plan details.

## 2021-06-29 NOTE — Assessment & Plan Note (Signed)
Patient presents for scheduled left hip intra-articular injection, tolerated well, post care reviewed.  She can dose meloxicam that was previously prescribed on an as-needed basis until symptoms respond to cortisone.  Additionally, we have written for physical therapy at the last visit for her to start once weekly.  I have advised her to contact her office for any lingering symptoms after therapy course has finished.  She can otherwise follow-up as needed.

## 2021-06-30 NOTE — Addendum Note (Signed)
Addended by: Lennart Pall on: 06/30/2021 09:34 AM   Modules accepted: Orders

## 2021-07-03 ENCOUNTER — Encounter: Payer: Self-pay | Admitting: Physical Therapy

## 2021-07-03 ENCOUNTER — Ambulatory Visit: Payer: Medicare PPO | Attending: Family Medicine | Admitting: Physical Therapy

## 2021-07-03 ENCOUNTER — Other Ambulatory Visit: Payer: Self-pay

## 2021-07-03 DIAGNOSIS — R29898 Other symptoms and signs involving the musculoskeletal system: Secondary | ICD-10-CM | POA: Insufficient documentation

## 2021-07-03 DIAGNOSIS — M79605 Pain in left leg: Secondary | ICD-10-CM | POA: Diagnosis not present

## 2021-07-03 DIAGNOSIS — M7062 Trochanteric bursitis, left hip: Secondary | ICD-10-CM | POA: Insufficient documentation

## 2021-07-03 DIAGNOSIS — M25552 Pain in left hip: Secondary | ICD-10-CM | POA: Insufficient documentation

## 2021-07-03 DIAGNOSIS — M1612 Unilateral primary osteoarthritis, left hip: Secondary | ICD-10-CM | POA: Insufficient documentation

## 2021-07-03 DIAGNOSIS — M7602 Gluteal tendinitis, left hip: Secondary | ICD-10-CM

## 2021-07-03 NOTE — Therapy (Deleted)
[facility] [address] [place], [place], [address] Phone: [phone]   Fax:  [phone]  Physical Therapy Evaluation  Patient Details  Name: Gabriela Mullins MRN: [id number] Date of Birth: [birth year] Referring Provider (PT): Dr. Ashley Royalty   Encounter Date: 07/03/2021   PT End of Session - 07/03/21 1444     Visit Number 1    Number of Visits 12    Date for PT Re-Evaluation 08/28/21    Authorization - Visit Number 1    Authorization - Number of Visits 10    PT Start Time 0720    PT Stop Time 0830    PT Time Calculation (min) 70 min    Activity Tolerance Patient tolerated treatment well    Behavior During Therapy [facility] for tasks assessed/performed             Past Medical History:  Diagnosis Date   Hyperlipidemia    Nephrolithiasis    Osteopenia     Past Surgical History:  Procedure Laterality Date   CHOLECYSTECTOMY  1990   EXTRACORPOREAL SHOCK WAVE LITHOTRIPSY     TONSILLECTOMY AND ADENOIDECTOMY     URETEROLITHOTOMY  1970   WISDOM TOOTH EXTRACTION Bilateral     There were no vitals filed for this visit.    Subjective Assessment - 07/03/21 1457     Subjective Pt. is 4 days post corticosteroid shot of L hip. Pt. states the shot gave her immediate relief and is having little to no pain since. Pt. was not able to sleep with GTPS on her left side and even when laying on her right she would wake up every hour. Pt. reports no pain today but prior to the corticosteroid shot the pain could get up to an 8/10 NPS. Pt. feels the pain in her hip would sometimes radiate down her knee and affect her gait pattern. Pt. noticed the hip pain in October but thought it was normal arthritic pain but became more concerned as the pain progressed.    Pertinent History Pt. lives an active lifestyle and wants to get back to the gym as soon as possible. Pt. does not have a history of hip pain but had intial onset 3-4 months ago. Pt. had some  worry and acceptance that it was normal arthritis of L hip but pain became unbearable. Pt. was unable to sleep as the pain was too much and had a positive outlook during the eval as she was able to get good sleep post corticosteroid shot.  Pt. lives at home with husband and is looking to progress to 1x/week PT and become self sufficient.    Limitations Walking    Patient Stated Goals Pt. would like to get back to working out on her own and get out of PT as quick as possible.    Currently in Pain? No/denies    Pain Score 0-No pain             OBJECTIVE   MUSCULOSKELETAL: Tremor: Normal Bulk: Normal Tone: Normal   Lumbar Screen AROM:  Flexion- WNL with some pull in L hip Extension- WNL L lateral flexion- WNL R LF- some hip pull on contralateral side Rotation- WNL   SLR- not indicated   Knee Screen- AROM with OP- WNL   Palpation- Pt. states and points out tenderness on left greater trochanter prior to cortisone shot    Strength R/L Hip Flexion (4+*/4) pain in contralateral hip Hip Extension (4+*/ 4) Hip IR (5/  4*) Hip ER (5/ 4*) Hip abd (4+/4*) concordant pain Hip add (5/5) Knee flexion- (5/5) Knee extension (4+/4+) * = pain   AROM (R/L) F- (105/106) E -(21/ 19) with more effort Abd- WNL pain with L hip AROM IR 40/30* ER 35/20* * = pain  Accessory Motions/Glides Hip AP- R is WNL, L has an increase of pulling sensation  LAD-feels relief   NEUROLOGICAL:   Mental Status Patient is oriented to person, place and time. Recent memory is intact. Remote memory is intact. Attention span and concentration are intact. Expressive speech is intact. Patient's fund of knowledge is within normal limits for educational level.      SPECIAL TESTS FABER (+) on left side to help rule in GTPS   Objective measurements completed on examination: See above findings.    Patient will benefit from skilled therapeutic intervention in order to improve the following deficits  and impairments:  Decreased endurance, Decreased activity tolerance, Impaired perceived functional ability, Pain, Decreased strength, Decreased range of motion, Decreased mobility  Visit Diagnosis: Weakness of left lower extremity  Greater trochanteric bursitis of left hip  Pain of left lower extremity     Problem List Patient Active Problem List   Diagnosis Date Noted   Greater trochanteric pain syndrome of left lower extremity 06/27/2021   Primary osteoarthritis of left hip 06/27/2021   Prediabetes 03/29/2021   Osteopenia determined by x-ray 03/29/2021   Hypercholesterolemia 09/30/2013   Nephrolithiasis 09/30/2013   Double voiding 04/15/2013   Incomplete emptying of bladder 04/09/2012    Cammie Mcgee, PT 07/04/2021, 10:39 AM  [facility] [address] [place], [place], [address] Phone: [phone]   Fax:  [phone]  Name: KAMILLE TOOMEY MRN: [id number] Date of Birth: 1956-03-15

## 2021-07-04 NOTE — Therapy (Signed)
[facility] [address] [place], [place], [address] Phone: [phone]   Fax:  [phone]  Physical Therapy Evaluation  Patient Details  Name: Gabriela Mullins MRN: [id number] Date of Birth: 10-Nov-1955 Referring Provider (PT): Dr. Ashley Royalty   Encounter Date: 07/03/2021   PT End of Session - 07/03/21 1444     Visit Number 1    Number of Visits 12    Date for PT Re-Evaluation 08/28/21    Authorization - Visit Number 1    Authorization - Number of Visits 10    PT Start Time 0720    PT Stop Time 0830    PT Time Calculation (min) 70 min    Activity Tolerance Patient tolerated treatment well    Behavior During Therapy [facility] for tasks assessed/performed             Past Medical History:  Diagnosis Date   Hyperlipidemia    Nephrolithiasis    Osteopenia     Past Surgical History:  Procedure Laterality Date   CHOLECYSTECTOMY  1990   EXTRACORPOREAL SHOCK WAVE LITHOTRIPSY     TONSILLECTOMY AND ADENOIDECTOMY     URETEROLITHOTOMY  1970   WISDOM TOOTH EXTRACTION Bilateral     There were no vitals filed for this visit.    Subjective Assessment - 07/03/21 1457     Subjective Pt. is 4 days post corticosteroid shot of L hip. Pt. states the shot gave her immediate relief and is having little to no pain since. Pt. was not able to sleep with GTPS on her left side and even when laying on her right she would wake up every hour. Pt. reports no pain today but prior to the corticosteroid shot the pain could get up to an 8/10 NPS. Pt. feels the pain in her hip would sometimes radiate down her knee and affect her gait pattern. Pt. noticed the hip pain in October but thought it was normal arthritic pain but became more concerned as the pain progressed.    Pertinent History Pt. lives an active lifestyle and wants to get back to the gym as soon as possible. Pt. does not have a history of hip pain but had intial onset 3-4 months ago. Pt. had some  worry and acceptance that it was normal arthritis of L hip but pain became unbearable. Pt. was unable to sleep as the pain was too much and had a positive outlook during the eval as she was able to get good sleep post corticosteroid shot.  Pt. lives at home with husband and is looking to progress to 1x/week PT and become self sufficient.    Limitations Walking    Patient Stated Goals Pt. would like to get back to working out on her own and get out of PT as quick as possible.    Currently in Pain? No/denies    Pain Score 0-No pain                [facility] PT Assessment - 07/04/21 0001       Assessment   Medical Diagnosis L greater trochanteric pain syndrome/ hip pain    Referring Provider (PT) Dr. Ashley Royalty    Onset Date/Surgical Date 03/29/21    Next MD Visit PRN    Prior Therapy No      Restrictions   Weight Bearing Restrictions No      Balance Screen   Has the patient fallen in the past 6 months No  Home Environment   Living Environment Private residence      Prior Function   Level of Independence Independent      Cognition   Overall Cognitive Status Within Functional Limits for tasks assessed            OBJECTIVE   MUSCULOSKELETAL: Tremor: Normal Bulk: Normal Tone: Normal   Lumbar Screen AROM:  Flexion- WNL with some pull in L hip Extension- WNL L lateral flexion- WNL R LF- some hip pull on contralateral side Rotation- WNL   SLR- not indicated   Knee Screen- AROM with OP- WNL   Palpation- Pt. states and points out tenderness on left greater trochanter prior to cortisone shot    Strength R/L Hip Flexion (4+*/4) pain in contralateral hip Hip Extension (4+*/ 4) Hip IR (5/ 4*) Hip ER (5/ 4*) Hip abd (4+/4*) concordant pain Hip add (5/5) Knee flexion- (5/5) Knee extension (4+/4+) * = pain   AROM (R/L) F- (105/106) E -(21/ 19) with more effort Abd- WNL pain with L hip AROM IR 40/30* ER 35/20* * = pain  Accessory Motions/Glides Hip AP-  R is WNL, L has an increase of pulling sensation  LAD-feels relief   NEUROLOGICAL:   Mental Status Patient is oriented to person, place and time. Recent memory is intact. Remote memory is intact. Attention span and concentration are intact. Expressive speech is intact. Patient's fund of knowledge is within normal limits for educational level.      SPECIAL TESTS FABER (+) on left side to help rule in GTPS   Objective measurements completed on examination: See above findings.      PT Education - 07/04/21 0935     Education Details HEP: [id number]    Person(s) Educated Patient    Methods Explanation;Demonstration;Handout    Comprehension Verbalized understanding;Returned demonstration                 PT Long Term Goals - 07/04/21 0942       PT LONG TERM GOAL #1   Title Pt. will improve FOTO score to >79 to improve pain free functional mobility.    Baseline 1/30:78    Time 8    Period Weeks    Status New    Target Date 08/28/21      PT LONG TERM GOAL #2   Title Pt. will increase L LE muscle strength 1/2 muscle grade to allow for her to perform all ADLs with less leg pain and fatigue.    Baseline Hip Flexion (4+*/4),  Hip Extension (4+*/ 4),  Hip IR (5/ 4*),  Hip ER (5/ 4*),  Hip abd (4+/4*)    Time 8    Period Weeks    Status New    Target Date 08/28/21      PT LONG TERM GOAL #3   Title Pt. will return to normal gym routine of 2-3 days per week while being self suffcient and not limited by hip pain.    Baseline Pt. is currently avoiding gym program with fear of increasing L hip pain or making it worse.    Time 8    Period Weeks    Status New    Target Date 08/28/21      PT LONG TERM GOAL #4   Title Pt will be independent with HEP in to maintain L Hip strength and keep pain down to a 2/10 NPS in order to improve pain-free function during ADLs.    Baseline worse NPS: 8/10  Time 8    Period Weeks    Status New    Target Date 08/28/21                 Plan - 07/04/21 0948     Clinical Impression Statement Pt. is a 66 y.o. female with L hip pain post dx. of GTPS of L buttock. Pt. had initial onset in October and had a steady increase in pain through January. Pt. was unable to lay on L side while sleeping without excruciating pain. Pt. would still experience pain lying on R side but was able to sleep for a few hours at a time. Pt recieved a corticosteroid shot 06/29/2021 and has had immediate relief in L hip.  Pt. shows decrease in gross L LE strength during MMT (see objective flowsheet) and has a slight increase in L hip pain during test and measures. Pt. is anxious to return to her gym routine and get out of PT quickly.  Pt. will benefit from a strong HEP and education on hip bursitis post coricosteroid shot. Pt. will progress to low load long duration strengthening exercises in L LE and break down gym program with PT going forward.    Examination-Activity Limitations Bend;Sleep;Squat;Stairs    Examination-Participation Restrictions Community Activity    Stability/Clinical Decision Making Evolving/Moderate complexity    Clinical Decision Making Moderate    Rehab Potential Fair    PT Frequency 2x / week    PT Duration 8 weeks    PT Treatment/Interventions ADLs/Self Care Home Management;Moist Heat;Cryotherapy;Gait training;Functional mobility training;Therapeutic activities;Therapeutic exercise;Neuromuscular re-education;Manual techniques;Passive range of motion;Patient/family education;Balance training;Stair training;Electrical Stimulation    PT Next Visit Plan Update HEP, break down gym program with Pt.    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 [place], [place], [address] Phone: [phone]   Fax:  [phone]  Name: Lyndle HerrlichKaran K Vanhoose MRN: [id number] Date of Birth: 04/12/1956

## 2021-07-04 NOTE — Patient Instructions (Signed)
Access Code: [facility] URL: [email or web address] Date: 07/04/2021 Prepared by: Dorene Grebe  Exercises ITB Stretch at [employer] - 1 x daily - 4 x weekly - 3 sets - 3 reps - 30 hold Isometric Gluteus Medius at Wall - 1 x daily - 4 x weekly - 2 sets - 10 reps - 10 hold Sidelying Hip Abduction - 1 x daily - 4 x weekly - 2 sets - 10 reps  Patient Education Trochanteric Bursitis

## 2021-07-05 ENCOUNTER — Other Ambulatory Visit: Payer: Self-pay

## 2021-07-05 ENCOUNTER — Ambulatory Visit: Payer: Medicare PPO | Attending: Family Medicine | Admitting: Physical Therapy

## 2021-07-05 ENCOUNTER — Encounter: Payer: Self-pay | Admitting: Physical Therapy

## 2021-07-05 DIAGNOSIS — R29898 Other symptoms and signs involving the musculoskeletal system: Secondary | ICD-10-CM | POA: Insufficient documentation

## 2021-07-05 DIAGNOSIS — M7062 Trochanteric bursitis, left hip: Secondary | ICD-10-CM | POA: Insufficient documentation

## 2021-07-05 DIAGNOSIS — M79605 Pain in left leg: Secondary | ICD-10-CM | POA: Diagnosis not present

## 2021-07-05 NOTE — Therapy (Addendum)
[facility] [address] [place], [place], [address] Phone: [phone]   Fax:  [phone]  Physical Therapy Treatment  Patient Details  Name: Gabriela Mullins MRN: [id number] Date of Birth: 01/15/1956 Referring Provider (PT): Dr. Zigmund Daniel   Encounter Date: 07/05/2021   PT End of Session - 07/05/21 1320     Visit Number 2    Number of Visits 12    Date for PT Re-Evaluation 08/28/21    Authorization - Visit Number 2    Authorization - Number of Visits 10    PT Start Time 0812    PT Stop Time 0922    PT Time Calculation (min) 70 min    Activity Tolerance Patient tolerated treatment well    Behavior During Therapy [facility] for tasks assessed/performed             Past Medical History:  Diagnosis Date   Hyperlipidemia    Nephrolithiasis    Osteopenia     Past Surgical History:  Procedure Laterality Date   CHOLECYSTECTOMY  1990   EXTRACORPOREAL SHOCK WAVE LITHOTRIPSY     TONSILLECTOMY AND ADENOIDECTOMY     URETEROLITHOTOMY  1970   WISDOM TOOTH EXTRACTION Bilateral     There were no vitals filed for this visit.   Subjective Assessment - 07/05/21 0817     Subjective Pt. is still sleeping through the night post corticosteroid shot and states there is minimal L hip pain compared to before. Pt. does express concern the pain will come back and wants to get back to a normal gym routine.    Pertinent History Pt. lives an active lifestyle and wants to get back to the gym as soon as possible. Pt. does not have a history of hip pain but had intial onset 3-4 months ago. Pt. had some worry and acceptance that it was normal arthritis of L hip but pain became unbearable. Pt. was unable to sleep as the pain was too much and had a positive outlook during the eval as she was able to get good sleep post corticosteroid shot.  Pt. lives at home with husband and is looking to progress to 1x/week PT and become self sufficient.    Limitations  Walking    Patient Stated Goals Pt. would like to get back to working out on her own and get out of PT as quick as possible.    Currently in Pain? Yes    Pain Score 3     Pain Location Hip    Pain Orientation Left              Manual therapy:  Supine Hamstring/ gastroc/ piriformis stretches. 3x20-30 seconds each  Sidelying psoas/rectus femoris stretch 3x20-30 seconds each. Pt. Notes increase in pain lying on left side.  Negative Thomas test  Hooklying IT band stretch. Pt. Notes L hip pain.  Modified to prevent increase in pain.     Therex.  Nustep B UE/LE L3 for 10 minutes (consistent cadence/ no pain)  Supine knees to chest. 3x20 seconds  Walking in hallway 3 laps. Pt. Shows consistent step pattern.   Reviewed HEP.      PT Long Term Goals - 07/04/21 0942       PT LONG TERM GOAL #1   Title Pt. will improve FOTO score to >79 to improve pain free functional mobility.    Baseline 1/30:78    Time 8    Period Weeks    Status New  Target Date 08/28/21      PT LONG TERM GOAL #2   Title Pt. will increase L LE muscle strength 1/2 muscle grade to allow for her to perform all ADLs with less leg pain and fatigue.    Baseline Hip Flexion (4+*/4),  Hip Extension (4+*/ 4),  Hip IR (5/ 4*),  Hip ER (5/ 4*),  Hip abd (4+/4*)    Time 8    Period Weeks    Status New    Target Date 08/28/21      PT LONG TERM GOAL #3   Title Pt. will return to normal gym routine of 2-3 days per week while being self suffcient and not limited by hip pain.    Baseline Pt. is currently avoiding gym program with fear of increasing L hip pain or making it worse.    Time 8    Period Weeks    Status New    Target Date 08/28/21      PT LONG TERM GOAL #4   Title Pt will be independent with HEP in to maintain L Hip strength and keep pain down to a 2/10 NPS in order to improve pain-free function during ADLs.    Baseline worse NPS: 8/10    Time 8    Period Weeks    Status New    Target Date  08/28/21                   Plan - 07/05/21 1317     Clinical Impression Statement Pt. benefited from gentle PROM stretching as well education on her GTPS dx. Pt. is concerned with her L hip flaring back up and admittedly muscle gaurds before any L LE movement due to anticipating pain. Pt. is educated on her gym routine which she plans to start back up in 2-3 weeks. Pt. will continue to benefit from a stretching and strengthening protocol in pain free ranges as well as icing after any activity.    Examination-Activity Limitations Bend;Sleep;Squat;Stairs    Examination-Participation Restrictions Community Activity    Stability/Clinical Decision Making Evolving/Moderate complexity    Clinical Decision Making Moderate    Rehab Potential Fair    PT Frequency 2x / week    PT Duration 8 weeks    PT Treatment/Interventions ADLs/Self Care Home Management;Moist Heat;Cryotherapy;Gait training;Functional mobility training;Therapeutic activities;Therapeutic exercise;Neuromuscular re-education;Manual techniques;Passive range of motion;Patient/family education;Balance training;Stair training;Electrical Stimulation    PT Next Visit Plan Update HEP, break down gym program with Pt.    PT Home Exercise Plan [facility]             Patient will benefit from skilled therapeutic intervention in order to improve the following deficits and impairments:  Decreased endurance, Decreased activity tolerance, Impaired perceived functional ability, Pain, Decreased strength, Decreased range of motion, Decreased mobility  Visit Diagnosis: Greater trochanteric bursitis of left hip  Pain of left lower extremity  Weakness of left lower extremity     Problem List Patient Active Problem List   Diagnosis Date Noted   Greater trochanteric pain syndrome of left lower extremity 06/27/2021   Primary osteoarthritis of left hip 06/27/2021   Prediabetes 03/29/2021   Osteopenia determined by x-ray 03/29/2021    Hypercholesterolemia 09/30/2013   Nephrolithiasis 09/30/2013   Double voiding 04/15/2013   Incomplete emptying of bladder 04/09/2012   Pura Spice, PT, DPT # [id number] Cleopatra Cedar, SPT 07/05/2021, 2:10 PM  [facility] [address] [place], [place], [address] Phone:  [phone]   Fax:  [phone]  Name: Gabriela Mullins MRN: [id number] Date of Birth: 1956/02/10

## 2021-07-10 ENCOUNTER — Ambulatory Visit: Payer: Medicare PPO | Admitting: Physical Therapy

## 2021-07-10 ENCOUNTER — Encounter: Payer: Self-pay | Admitting: Physical Therapy

## 2021-07-10 ENCOUNTER — Other Ambulatory Visit: Payer: Self-pay

## 2021-07-10 DIAGNOSIS — M7062 Trochanteric bursitis, left hip: Secondary | ICD-10-CM

## 2021-07-10 DIAGNOSIS — M79605 Pain in left leg: Secondary | ICD-10-CM

## 2021-07-10 DIAGNOSIS — R29898 Other symptoms and signs involving the musculoskeletal system: Secondary | ICD-10-CM | POA: Diagnosis not present

## 2021-07-10 NOTE — Therapy (Addendum)
[facility] [address] [place], [place], [address] Phone: [phone]   Fax:  [phone]  Physical Therapy Treatment  Patient Details  Name: Gabriela Mullins MRN: [id number] Date of Birth: 25-Feb-1956 Referring Provider (PT): Dr. Ashley Royalty   Encounter Date: 07/10/2021   PT End of Session - 07/10/21 0804     Visit Number 3    Number of Visits 12    Date for PT Re-Evaluation 08/28/21    Authorization - Visit Number 3    Authorization - Number of Visits 10    PT Start Time 0720    PT Stop Time 0813    PT Time Calculation (min) 53 min    Activity Tolerance Patient tolerated treatment well    Behavior During Therapy [facility] for tasks assessed/performed             Past Medical History:  Diagnosis Date   Hyperlipidemia    Nephrolithiasis    Osteopenia     Past Surgical History:  Procedure Laterality Date   CHOLECYSTECTOMY  1990   EXTRACORPOREAL SHOCK WAVE LITHOTRIPSY     TONSILLECTOMY AND ADENOIDECTOMY     URETEROLITHOTOMY  1970   WISDOM TOOTH EXTRACTION Bilateral     There were no vitals filed for this visit.   Subjective Assessment - 07/10/21 0725     Subjective Pt. is having an increase in pain post tx. session. Pt. states 12 hours post tx. pain greatly increased.  Pt. benefits from use of ice and ibuprofen for pain mgmt.    Pertinent History Pt. lives an active lifestyle and wants to get back to the gym as soon as possible. Pt. does not have a history of hip pain but had intial onset 3-4 months ago. Pt. had some worry and acceptance that it was normal arthritis of L hip but pain became unbearable. Pt. was unable to sleep as the pain was too much and had a positive outlook during the eval as she was able to get good sleep post corticosteroid shot.  Pt. lives at home with husband and is looking to progress to 1x/week PT and become self sufficient.    Limitations Walking    Patient Stated Goals Pt. would like to get back to  working out on her own and get out of PT as quick as possible.    Currently in Pain? Yes    Pain Score 3     Pain Location Hip    Pain Orientation Left    Pain Descriptors / Indicators Aching             Therex.   Nustep B UE/LE L2 for 5 minutes (consistent cadence/ no pain)   Hooklying IT band stretch 3x20-30 seconds each. Pt. Is able to perform stretch in pain free range   Manual therapy:   Supine Hamstring/ gastroc/ piriformis stretches. 3x20-30 seconds each   R sidelying psoas/rectus femoris stretch 3x20-30 seconds each. Pt. Notes increase in pain lying on left side (avoided secondary to pain).     Supine LAD/ AP mobs. To hip 3x20 seconds each. Pt. Notes  and increase in pressure but no increase in pain with AP mobs.    Ice to L hip in R sidelying after tx. session   PT Long Term Goals - 07/04/21 0942       PT LONG TERM GOAL #1   Title Pt. will improve FOTO score to >79 to improve pain free functional mobility.  Baseline 1/30:78    Time 8    Period Weeks    Status New    Target Date 08/28/21      PT LONG TERM GOAL #2   Title Pt. will increase L LE muscle strength 1/2 muscle grade to allow for her to perform all ADLs with less leg pain and fatigue.    Baseline Hip Flexion (4+*/4),  Hip Extension (4+*/ 4),  Hip IR (5/ 4*),  Hip ER (5/ 4*),  Hip abd (4+/4*)    Time 8    Period Weeks    Status New    Target Date 08/28/21      PT LONG TERM GOAL #3   Title Pt. will return to normal gym routine of 2-3 days per week while being self suffcient and not limited by hip pain.    Baseline Pt. is currently avoiding gym program with fear of increasing L hip pain or making it worse.    Time 8    Period Weeks    Status New    Target Date 08/28/21      PT LONG TERM GOAL #4   Title Pt will be independent with HEP in to maintain L Hip strength and keep pain down to a 2/10 NPS in order to improve pain-free function during ADLs.    Baseline worse NPS: 8/10    Time 8     Period Weeks    Status New    Target Date 08/28/21                   Plan - 07/10/21 6160     Clinical Impression Statement Pt. will continue to benefit from gentle PROM stretching and slowly progressing into a strengthening program to increase functional capacity to perform ADLs. Pt. is concerned with a general increase in pain and braces through all AROM of L hip in anticiapation of pain. PT. educates the pt. on GTPS and how the pain will fluctuate throughout POC. Pt. will progress program based on pain and overall response to treatment. Pt. does express concern that her L hip will never get better and wants to be able to live with less pain. PT. educates on the timline of GTPS and to stay positive with the POC.    Examination-Activity Limitations Bend;Sleep;Squat;Stairs    Examination-Participation Restrictions Community Activity    Stability/Clinical Decision Making Evolving/Moderate complexity    Clinical Decision Making Moderate    Rehab Potential Fair    PT Frequency 2x / week    PT Duration 8 weeks    PT Treatment/Interventions ADLs/Self Care Home Management;Moist Heat;Cryotherapy;Gait training;Functional mobility training;Therapeutic activities;Therapeutic exercise;Neuromuscular re-education;Manual techniques;Passive range of motion;Patient/family education;Balance training;Stair training;Electrical Stimulation    PT Next Visit Plan Update HEP, Review gym based ther.ex.    PT Home Exercise Plan [facility]             Patient will benefit from skilled therapeutic intervention in order to improve the following deficits and impairments:  Decreased endurance, Decreased activity tolerance, Impaired perceived functional ability, Pain, Decreased strength, Decreased range of motion, Decreased mobility  Visit Diagnosis: Greater trochanteric bursitis of left hip  Pain of left lower extremity  Weakness of left lower extremity     Problem List Patient Active Problem List    Diagnosis Date Noted   Greater trochanteric pain syndrome of left lower extremity 06/27/2021   Primary osteoarthritis of left hip 06/27/2021   Prediabetes 03/29/2021   Osteopenia determined by x-ray 03/29/2021   Hypercholesterolemia  09/30/2013   Nephrolithiasis 09/30/2013   Double voiding 04/15/2013   Incomplete emptying of bladder 04/09/2012   Cammie Mcgee, PT, DPT # [id number] 07/10/2021, 9:29 AM  [facility] [address] [place], [place], [address] Phone: [phone]   Fax:  [phone]  Name: Gabriela Mullins MRN: [id number] Date of Birth: Oct 19, 1955

## 2021-07-12 ENCOUNTER — Other Ambulatory Visit: Payer: Self-pay

## 2021-07-12 ENCOUNTER — Encounter: Payer: Self-pay | Admitting: Physical Therapy

## 2021-07-12 ENCOUNTER — Ambulatory Visit: Payer: Medicare PPO | Admitting: Physical Therapy

## 2021-07-12 DIAGNOSIS — R29898 Other symptoms and signs involving the musculoskeletal system: Secondary | ICD-10-CM | POA: Diagnosis not present

## 2021-07-12 DIAGNOSIS — M7062 Trochanteric bursitis, left hip: Secondary | ICD-10-CM

## 2021-07-12 DIAGNOSIS — M79605 Pain in left leg: Secondary | ICD-10-CM

## 2021-07-12 NOTE — Therapy (Addendum)
[facility] [address] [place], [place], [address] Phone: [phone]   Fax:  [phone]  Physical Therapy Treatment  Patient Details  Name: Gabriela Mullins MRN: [id number] Date of Birth: 1956-05-27 Referring Provider (PT): Dr. Zigmund Daniel   Encounter Date: 07/12/2021   PT End of Session - 07/12/21 0859     Visit Number 4    Number of Visits 12    Date for PT Re-Evaluation 08/28/21    Authorization - Visit Number 4    Authorization - Number of Visits 10    PT Start Time 0811    PT Stop Time 0859    PT Time Calculation (min) 48 min    Activity Tolerance Patient tolerated treatment well    Behavior During Therapy [facility] for tasks assessed/performed             Past Medical History:  Diagnosis Date   Hyperlipidemia    Nephrolithiasis    Osteopenia     Past Surgical History:  Procedure Laterality Date   CHOLECYSTECTOMY  1990   EXTRACORPOREAL SHOCK WAVE LITHOTRIPSY     TONSILLECTOMY AND ADENOIDECTOMY     URETEROLITHOTOMY  1970   WISDOM TOOTH EXTRACTION Bilateral     There were no vitals filed for this visit.   Subjective Assessment - 07/12/21 0815     Subjective Pt. arrives at clinic with minimal pain compared to last session. Pt. states she is happy with the limited pain she has felt after last tx.  Pt. benefits from use of ice throughout the day.    Pertinent History Pt. lives an active lifestyle and wants to get back to the gym as soon as possible. Pt. does not have a history of hip pain but had intial onset 3-4 months ago. Pt. had some worry and acceptance that it was normal arthritis of L hip but pain became unbearable. Pt. was unable to sleep as the pain was too much and had a positive outlook during the eval as she was able to get good sleep post corticosteroid shot.  Pt. lives at home with husband and is looking to progress to 1x/week PT and become self sufficient.    Limitations Walking    Patient Stated Goals Pt.  would like to get back to working out on her own and get out of PT as quick as possible.    Currently in Pain? Yes    Pain Score 3     Pain Location Hip    Pain Orientation Left             Therex.   Nustep B UE/LE L2 for 8 minutes (consistent cadence/ no pain)   Hooklying IT band stretch 3x20-30 seconds each. Pt. Is able to perform stretch in pain free range   Supine 10x isometric contraction with hip abd./add. with PT hand at ankle  3" eccentric step down in //-bars. 10x. Pt. Not able to tolerate 6" step.     Manual therapy:   Supine Hamstring/ gastroc/ piriformis stretches. 3x20-30 seconds each   Supine LAD/ AP mobs. To hip 3x20 seconds each. Pt. Notes  and increase in pressure but no increase in pain with AP mobs.   Ice to L hip in R sidelying after tx. session    PT Long Term Goals - 07/04/21 0942       PT LONG TERM GOAL #1   Title Pt. will improve FOTO score to >79 to improve pain free functional mobility.  Baseline 1/30:78    Time 8    Period Weeks    Status New    Target Date 08/28/21      PT LONG TERM GOAL #2   Title Pt. will increase L LE muscle strength 1/2 muscle grade to allow for her to perform all ADLs with less leg pain and fatigue.    Baseline Hip Flexion (4+*/4),  Hip Extension (4+*/ 4),  Hip IR (5/ 4*),  Hip ER (5/ 4*),  Hip abd (4+/4*)    Time 8    Period Weeks    Status New    Target Date 08/28/21      PT LONG TERM GOAL #3   Title Pt. will return to normal gym routine of 2-3 days per week while being self suffcient and not limited by hip pain.    Baseline Pt. is currently avoiding gym program with fear of increasing L hip pain or making it worse.    Time 8    Period Weeks    Status New    Target Date 08/28/21      PT LONG TERM GOAL #4   Title Pt will be independent with HEP in to maintain L Hip strength and keep pain down to a 2/10 NPS in order to improve pain-free function during ADLs.    Baseline worse NPS: 8/10    Time 8     Period Weeks    Status New    Target Date 08/28/21               Plan - 07/12/21 0902     Clinical Impression Statement Pt. is progressing with her ability to perform PROM stretches and becoming more independent with her HEP. Pt. will benefit from gentle stretching into an isometric and limited ROM eccentric strengthening program. Pt. NPS varies greatly based on activity level and her mentality limits her ability to move. Pt. requires PT education on GTPS and the POC to limit her fear avoidance of L LE movement. Pt. will progress in ROM and strength exercises as tolerated with the goal for her to become independent with her gym programs with less overall pain.    Examination-Activity Limitations Bend;Sleep;Squat;Stairs    Examination-Participation Restrictions Community Activity    Stability/Clinical Decision Making Evolving/Moderate complexity    Clinical Decision Making Moderate    Rehab Potential Fair    PT Frequency 2x / week    PT Duration 8 weeks    PT Treatment/Interventions ADLs/Self Care Home Management;Moist Heat;Cryotherapy;Gait training;Functional mobility training;Therapeutic activities;Therapeutic exercise;Neuromuscular re-education;Manual techniques;Passive range of motion;Patient/family education;Balance training;Stair training;Electrical Stimulation    PT Next Visit Plan Progress to isometric strengthening    PT Home Exercise Plan Access Code: [email or web address]             Patient will benefit from skilled therapeutic intervention in order to improve the following deficits and impairments:  Decreased endurance, Decreased activity tolerance, Impaired perceived functional ability, Pain, Decreased strength, Decreased range of motion, Decreased mobility  Visit Diagnosis: Greater trochanteric bursitis of left hip  Pain of left lower extremity     Problem List Patient Active Problem List   Diagnosis Date Noted   Greater trochanteric pain syndrome of left lower extremity  06/27/2021   Primary osteoarthritis of left hip 06/27/2021   Prediabetes 03/29/2021   Osteopenia determined by x-ray 03/29/2021   Hypercholesterolemia 09/30/2013   Nephrolithiasis 09/30/2013   Double voiding 04/15/2013   Incomplete emptying of bladder 04/09/2012   Pura Spice, PT, DPT #  [place], SPT 07/12/2021, 1:55 PM  [facility] [address] [place], [place], [address] Phone: [phone]   Fax:  [phone]  Name: Gabriela Mullins MRN: [id number] Date of Birth: 13-Apr-1956

## 2021-07-12 NOTE — Patient Instructions (Signed)
Access Code: [email or web address] URL: [email or web address] Date: 07/12/2021 Prepared by: Dorene Grebe  Exercises Supine Hamstring Stretch with Strap - 1 x daily - 3 x weekly - 3 sets - 3 reps - 20 hold Supine ITB Stretch - 1 x daily - 3 x weekly - 3 sets - 3 reps - 20 hold Isometric Gluteus Medius at Wall - 1 x daily - 3 x weekly - 1 sets - 10 reps - 10 hold

## 2021-07-17 ENCOUNTER — Other Ambulatory Visit: Payer: Self-pay

## 2021-07-17 ENCOUNTER — Encounter: Payer: Self-pay | Admitting: Physical Therapy

## 2021-07-17 ENCOUNTER — Ambulatory Visit: Payer: Medicare PPO | Admitting: Physical Therapy

## 2021-07-17 DIAGNOSIS — M79605 Pain in left leg: Secondary | ICD-10-CM

## 2021-07-17 DIAGNOSIS — R29898 Other symptoms and signs involving the musculoskeletal system: Secondary | ICD-10-CM | POA: Diagnosis not present

## 2021-07-17 DIAGNOSIS — M7062 Trochanteric bursitis, left hip: Secondary | ICD-10-CM | POA: Diagnosis not present

## 2021-07-17 NOTE — Therapy (Addendum)
[facility] [address] [place], [place], [address] Phone: [phone]   Fax:  [phone]  Physical Therapy Treatment  Patient Details  Name: Gabriela Mullins MRN: [id number] Date of Birth: [birth year] Referring Provider (PT): Dr. Ashley Royalty   Encounter Date: 07/17/2021   PT End of Session - 07/17/21 0733     Visit Number 5    Number of Visits 12    Date for PT Re-Evaluation 08/28/21    Authorization - Visit Number 5    Authorization - Number of Visits 10    PT Start Time 0725    PT Stop Time 0817    PT Time Calculation (min) 52 min    Activity Tolerance Patient tolerated treatment well    Behavior During Therapy [facility] for tasks assessed/performed             Past Medical History:  Diagnosis Date   Hyperlipidemia    Nephrolithiasis    Osteopenia     Past Surgical History:  Procedure Laterality Date   CHOLECYSTECTOMY  1990   EXTRACORPOREAL SHOCK WAVE LITHOTRIPSY     TONSILLECTOMY AND ADENOIDECTOMY     URETEROLITHOTOMY  1970   WISDOM TOOTH EXTRACTION Bilateral     There were no vitals filed for this visit.   Subjective Assessment - 07/17/21 0735     Subjective Pt. arrives in clinic with no pain. Pt. had some increase in L hip pain Sunday due to activity level over the weekend but states she felt better after last tx. session.    Pertinent History Pt. lives an active lifestyle and wants to get back to the gym as soon as possible. Pt. does not have a history of hip pain but had intial onset 3-4 months ago. Pt. had some worry and acceptance that it was normal arthritis of L hip but pain became unbearable. Pt. was unable to sleep as the pain was too much and had a positive outlook during the eval as she was able to get good sleep post corticosteroid shot.  Pt. lives at home with husband and is looking to progress to 1x/week PT and become self sufficient.    Limitations Walking    Patient Stated Goals Pt. would like to get  back to working out on her own and get out of PT as quick as possible.    Currently in Pain? No/denies    Pain Location Hip    Pain Orientation Left             Therex.   Nustep B UE/LE L2 for 8 minutes (consistent cadence/ no pain)   Hooklying IT band stretch 3x20-30 seconds each. Pt. Is able to perform stretch in pain free range   Supine 10x isometric contraction with hip abd./add. with PT hand at ankle   Standing in //-bars 2x5 hip abd/ext. Pt. Notes limitations in her movement due to anticipating pain. PT. Cues to move through and pain free AROM     Manual therapy:   Supine Hamstring/ gastroc/ piriformis stretches. 3x20-30 seconds each   Supine LAD. To hip 3x20 seconds each. Pt. Notes    Ice to L hip in R sidelying after tx. session    PT Long Term Goals - 07/04/21 0942       PT LONG TERM GOAL #1   Title Pt. will improve FOTO score to >79 to improve pain free functional mobility.    Baseline 1/30:78    Time 8  Period Weeks    Status New    Target Date 08/28/21      PT LONG TERM GOAL #2   Title Pt. will increase L LE muscle strength 1/2 muscle grade to allow for her to perform all ADLs with less leg pain and fatigue.    Baseline Hip Flexion (4+*/4),  Hip Extension (4+*/ 4),  Hip IR (5/ 4*),  Hip ER (5/ 4*),  Hip abd (4+/4*)    Time 8    Period Weeks    Status New    Target Date 08/28/21      PT LONG TERM GOAL #3   Title Pt. will return to normal gym routine of 2-3 days per week while being self suffcient and not limited by hip pain.    Baseline Pt. is currently avoiding gym program with fear of increasing L hip pain or making it worse.    Time 8    Period Weeks    Status New    Target Date 08/28/21      PT LONG TERM GOAL #4   Title Pt will be independent with HEP in to maintain L Hip strength and keep pain down to a 2/10 NPS in order to improve pain-free function during ADLs.    Baseline worse NPS: 8/10    Time 8    Period Weeks    Status New     Target Date 08/28/21               Plan - 07/17/21 0813     Clinical Impression Statement Pt. arrives to clinic with no L hip pain for the first time during POC. Pt. can increase overall activity level at home but notes there is an increase in L hip pain based on how much walking she does. Pt. continues to be limited in her movement patterns due to anticipating pain but does not feel pain during AROM throughout tx. Pt. benefits from education on GTPS and how her POC will progress. Pt. will benefit from gentle stretching and isometrics during her HEP and progressing to slow load long duration eccentric/concentric L hip exercises in the clinic to build tolerance and strength through AROM.    Examination-Activity Limitations Bend;Sleep;Squat;Stairs    Examination-Participation Restrictions Community Activity    Stability/Clinical Decision Making Evolving/Moderate complexity    Clinical Decision Making Moderate    Rehab Potential Fair    PT Frequency 2x / week    PT Duration 8 weeks    PT Treatment/Interventions ADLs/Self Care Home Management;Moist Heat;Cryotherapy;Gait training;Functional mobility training;Therapeutic activities;Therapeutic exercise;Neuromuscular re-education;Manual techniques;Passive range of motion;Patient/family education;Balance training;Stair training;Electrical Stimulation    PT Next Visit Plan Progress to eccentric strengthening    PT Home Exercise Plan Access Code: [email or web address]             Patient will benefit from skilled therapeutic intervention in order to improve the following deficits and impairments:  Decreased endurance, Decreased activity tolerance, Impaired perceived functional ability, Pain, Decreased strength, Decreased range of motion, Decreased mobility  Visit Diagnosis: Greater trochanteric bursitis of left hip  Pain of left lower extremity     Problem List Patient Active Problem List   Diagnosis Date Noted   Greater trochanteric pain syndrome  of left lower extremity 06/27/2021   Primary osteoarthritis of left hip 06/27/2021   Prediabetes 03/29/2021   Osteopenia determined by x-ray 03/29/2021   Hypercholesterolemia 09/30/2013   Nephrolithiasis 09/30/2013   Double voiding 04/15/2013   Incomplete emptying of bladder 04/09/2012   Casimiro Needle  Fuller Song, PT, DPT # [id number] Bo Merino, SPT 07/17/2021, 9:41 AM  [facility] [address] [place], [place], [address] Phone: [phone]   Fax:  [phone]  Name: ERMINIE FOULKS MRN: [id number] Date of Birth: Mar 11, 1956

## 2021-07-19 ENCOUNTER — Ambulatory Visit: Payer: Medicare PPO | Admitting: Physical Therapy

## 2021-07-19 ENCOUNTER — Other Ambulatory Visit: Payer: Self-pay

## 2021-07-19 DIAGNOSIS — M7062 Trochanteric bursitis, left hip: Secondary | ICD-10-CM

## 2021-07-19 DIAGNOSIS — R29898 Other symptoms and signs involving the musculoskeletal system: Secondary | ICD-10-CM | POA: Diagnosis not present

## 2021-07-19 DIAGNOSIS — M79605 Pain in left leg: Secondary | ICD-10-CM | POA: Diagnosis not present

## 2021-07-19 NOTE — Patient Instructions (Signed)
Access Code: [email or web address] URL: [email or web address] Date: 07/19/2021 Prepared by: Dorcas Carrow  Exercises Supine Hamstring Stretch with Strap - 1 x daily - 3 x weekly - 3 sets - 3 reps - 20 hold Supine ITB Stretch - 1 x daily - 3 x weekly - 3 sets - 3 reps - 20 hold Isometric Gluteus Medius at Wall - 1 x daily - 3 x weekly - 1 sets - 10 reps - 10 hold Hooklying Clamshell with Resistance - 1 x daily - 3 x weekly - 2 sets - 8 reps

## 2021-07-19 NOTE — Therapy (Addendum)
[facility] [address] [place], [place], [address] Phone: [phone]   Fax:  [phone]  Physical Therapy Treatment  Patient Details  Name: Gabriela Mullins MRN: [id number] Date of Birth: 04/01/1956 Referring Provider (PT): Dr. Ashley Royalty   Encounter Date: 07/19/2021   PT End of Session - 07/19/21 0812     Visit Number 6    Number of Visits 12    Date for PT Re-Evaluation 08/28/21    Authorization - Visit Number 6    Authorization - Number of Visits 10    PT Start Time 0812    PT Stop Time 0902    PT Time Calculation (min) 50 min    Activity Tolerance Patient tolerated treatment well    Behavior During Therapy [facility] for tasks assessed/performed             Past Medical History:  Diagnosis Date   Hyperlipidemia    Nephrolithiasis    Osteopenia     Past Surgical History:  Procedure Laterality Date   CHOLECYSTECTOMY  1990   EXTRACORPOREAL SHOCK WAVE LITHOTRIPSY     TONSILLECTOMY AND ADENOIDECTOMY     URETEROLITHOTOMY  1970   WISDOM TOOTH EXTRACTION Bilateral     There were no vitals filed for this visit.   Subjective Assessment - 07/19/21 0812     Subjective Pt. continues to report no pain at the beginning of tx. Pt. has started taking meloxicam everyday this week and thinks it is helping with her pain.    Pertinent History Pt. lives an active lifestyle and wants to get back to the gym as soon as possible. Pt. does not have a history of hip pain but had intial onset 3-4 months ago. Pt. had some worry and acceptance that it was normal arthritis of L hip but pain became unbearable. Pt. was unable to sleep as the pain was too much and had a positive outlook during the eval as she was able to get good sleep post corticosteroid shot.  Pt. lives at home with husband and is looking to progress to 1x/week PT and become self sufficient.    Limitations Walking    Patient Stated Goals Pt. would like to get back to working out on  her own and get out of PT as quick as possible.    Currently in Pain? No/denies    Pain Score 0-No pain    Pain Location Hip    Pain Orientation Left             Therex.   Nustep B UE/LE L2 for 8 minutes (consistent cadence/ no pain)   Hooklying IT band stretch 3x20-30 seconds each. Pt. Is able to perform stretch in pain free range   Supine 10x isometric contraction with hip abd./add./ext. with PT hand at ankle   Hooklying clam shell with GTB 2x10  Hooklying ball squeeze 2x10  See updated HEP     Manual therapy:   Supine Hamstring/ gastroc/ piriformis stretches. 3x20-30 seconds each  Supine Hip mobs A-P 3x20 second bouts grade II-III   Supine LAD. To hip 3x20 seconds each. Pt. Notes    Ice to L hip in R sidelying after tx. session    PT Long Term Goals - 07/04/21 0942       PT LONG TERM GOAL #1   Title Pt. will improve FOTO score to >79 to improve pain free functional mobility.    Baseline 1/30:78    Time 8  Period Weeks    Status New    Target Date 08/28/21      PT LONG TERM GOAL #2   Title Pt. will increase L LE muscle strength 1/2 muscle grade to allow for her to perform all ADLs with less leg pain and fatigue.    Baseline Hip Flexion (4+*/4),  Hip Extension (4+*/ 4),  Hip IR (5/ 4*),  Hip ER (5/ 4*),  Hip abd (4+/4*)    Time 8    Period Weeks    Status New    Target Date 08/28/21      PT LONG TERM GOAL #3   Title Pt. will return to normal gym routine of 2-3 days per week while being self suffcient and not limited by hip pain.    Baseline Pt. is currently avoiding gym program with fear of increasing L hip pain or making it worse.    Time 8    Period Weeks    Status New    Target Date 08/28/21      PT LONG TERM GOAL #4   Title Pt will be independent with HEP in to maintain L Hip strength and keep pain down to a 2/10 NPS in order to improve pain-free function during ADLs.    Baseline worse NPS: 8/10    Time 8    Period Weeks    Status New     Target Date 08/28/21                   Plan - 07/19/21 0857     Clinical Impression Statement Pt. has no L hip pain prior to tx. Pt. feels her pain is dependent on how active she is the day prior. Pt. is able to perform more strengthening exercises before feeling L hip pain but is limited by muscle guarding/ fear of potential flare up. Pt. does feel the PT and meloxicam are helping with her pain but is eager to be able to complete her functional ADLs without being limited by her L hip. Pt. will continue to benefit from PT education and progression to eccentric hip strengthening exercises while becoming independent with HEP to allow her to manage her GTPS.    Examination-Activity Limitations Bend;Sleep;Squat;Stairs    Examination-Participation Restrictions Community Activity    Stability/Clinical Decision Making Evolving/Moderate complexity    Clinical Decision Making Moderate    Rehab Potential Fair    PT Frequency 2x / week    PT Duration 8 weeks    PT Treatment/Interventions ADLs/Self Care Home Management;Moist Heat;Cryotherapy;Gait training;Functional mobility training;Therapeutic activities;Therapeutic exercise;Neuromuscular re-education;Manual techniques;Passive range of motion;Patient/family education;Balance training;Stair training;Electrical Stimulation    PT Next Visit Plan Progress to eccentric strengthening    PT Home Exercise Plan Access Code: [email or web address]             Patient will benefit from skilled therapeutic intervention in order to improve the following deficits and impairments:  Decreased endurance, Decreased activity tolerance, Impaired perceived functional ability, Pain, Decreased strength, Decreased range of motion, Decreased mobility  Visit Diagnosis: Greater trochanteric bursitis of left hip  Pain of left lower extremity     Problem List Patient Active Problem List   Diagnosis Date Noted   Greater trochanteric pain syndrome of left lower extremity  06/27/2021   Primary osteoarthritis of left hip 06/27/2021   Prediabetes 03/29/2021   Osteopenia determined by x-ray 03/29/2021   Hypercholesterolemia 09/30/2013   Nephrolithiasis 09/30/2013   Double voiding 04/15/2013   Incomplete emptying of bladder 04/09/2012  Cammie Mcgee, PT, DPT # [id number] Bo Merino, SPT 07/19/2021, 9:14 AM  [facility] [address] [place], [place], [address] Phone: [phone]   Fax:  [phone]  Name: Gabriela Mullins MRN: [id number] Date of Birth: [birth year]

## 2021-07-24 ENCOUNTER — Encounter: Payer: Medicare PPO | Admitting: Physical Therapy

## 2021-07-26 ENCOUNTER — Encounter: Payer: Self-pay | Admitting: Physical Therapy

## 2021-07-26 ENCOUNTER — Ambulatory Visit: Payer: Medicare PPO | Admitting: Physical Therapy

## 2021-07-26 ENCOUNTER — Other Ambulatory Visit: Payer: Self-pay

## 2021-07-26 DIAGNOSIS — M7062 Trochanteric bursitis, left hip: Secondary | ICD-10-CM

## 2021-07-26 DIAGNOSIS — M79605 Pain in left leg: Secondary | ICD-10-CM | POA: Diagnosis not present

## 2021-07-26 DIAGNOSIS — R29898 Other symptoms and signs involving the musculoskeletal system: Secondary | ICD-10-CM | POA: Diagnosis not present

## 2021-07-26 NOTE — Therapy (Addendum)
[place] [facility] [address]. [place], [place], [address] Phone: [phone]   Fax:  [phone]  Physical Therapy Treatment  Patient Details  Name: Gabriela Mullins MRN: [id number] Date of Birth: 1955-07-02 Referring Provider (PT): Dr. Ashley Royalty   Encounter Date: 07/26/2021   PT End of Session - 07/26/21 0810     Visit Number 7    Number of Visits 12    Date for PT Re-Evaluation 08/28/21    Authorization - Visit Number 7    Authorization - Number of Visits 10    PT Start Time 0810    PT Stop Time 0901    PT Time Calculation (min) 51 min    Activity Tolerance Patient tolerated treatment well    Behavior During Therapy [facility] for tasks assessed/performed             Past Medical History:  Diagnosis Date   Hyperlipidemia    Nephrolithiasis    Osteopenia     Past Surgical History:  Procedure Laterality Date   CHOLECYSTECTOMY  1990   EXTRACORPOREAL SHOCK WAVE LITHOTRIPSY     TONSILLECTOMY AND ADENOIDECTOMY     URETEROLITHOTOMY  1970   WISDOM TOOTH EXTRACTION Bilateral     There were no vitals filed for this visit.   Subjective Assessment - 07/26/21 0815     Subjective Pt. reports no pain the past week after a very active weekend. Pt. feels the Meloxicam has been making her pain signifcantly better as she has taken it everyday for the past 10 days.    Pertinent History Pt. lives an active lifestyle and wants to get back to the gym as soon as possible. Pt. does not have a history of hip pain but had intial onset 3-4 months ago. Pt. had some worry and acceptance that it was normal arthritis of L hip but pain became unbearable. Pt. was unable to sleep as the pain was too much and had a positive outlook during the eval as she was able to get good sleep post corticosteroid shot.  Pt. lives at home with husband and is looking to progress to 1x/week PT and become self sufficient.    Limitations Walking    Patient Stated Goals Pt. would  like to get back to working out on her own and get out of PT as quick as possible.    Currently in Pain? No/denies    Pain Score 0-No pain    Pain Location Hip             Therex.   Nustep B UE/LE L3 for 8 minutes (consistent cadence/ no pain)   Supine 10x isometric contraction with hip abd./add./ext. with PT hand at ankle   Sidelying clam shell with GTB 2x10. Pt. Isolates L glute med.   Hooklying ball squeeze 2x10  Standing step down from 6" step. Eccentric loading of L hip. 2x5  Standing L hip extension. 10x     Manual therapy:   Supine Hamstring/ gastroc/ piriformis stretches. 3x20-30 seconds each   Supine Hip mobs A-P 3x20 second bouts grade II-III   Supine LAD. To hip 3x20 seconds each. Pt. Notes      PT Long Term Goals - 07/04/21 0942       PT LONG TERM GOAL #1   Title Pt. will improve FOTO score to >79 to improve pain free functional mobility.    Baseline 1/30:78    Time 8    Period Weeks  Status New    Target Date 08/28/21      PT LONG TERM GOAL #2   Title Pt. will increase L LE muscle strength 1/2 muscle grade to allow for her to perform all ADLs with less leg pain and fatigue.    Baseline Hip Flexion (4+*/4),  Hip Extension (4+*/ 4),  Hip IR (5/ 4*),  Hip ER (5/ 4*),  Hip abd (4+/4*)    Time 8    Period Weeks    Status New    Target Date 08/28/21      PT LONG TERM GOAL #3   Title Pt. will return to normal gym routine of 2-3 days per week while being self suffcient and not limited by hip pain.    Baseline Pt. is currently avoiding gym program with fear of increasing L hip pain or making it worse.    Time 8    Period Weeks    Status New    Target Date 08/28/21      PT LONG TERM GOAL #4   Title Pt will be independent with HEP in to maintain L Hip strength and keep pain down to a 2/10 NPS in order to improve pain-free function during ADLs.    Baseline worse NPS: 8/10    Time 8    Period Weeks    Status New    Target Date 08/28/21                  Plan - 07/26/21 1014     Clinical Impression Statement Pt. has been able to greatly increase her activity levels since regularly taking Meloxicam. Pt. is able to perform all strength exercises with proper form and no increase in L hip pain. PT progress therex. to standing in //-bars to increase pt.'s standing activity tolerance. Pt. was able to perform eccentric step down and standing L hip strength exercises with no pian for the first time since beginning PT. Pt. will continue to progress HEP and therex in the clinic as she tolerates it. Pt. is educated to slowly start to take the Meloxicam every other day so she does not become to dependent on it to reduce her pain. Pt. states she helped her friend tha past two days moving a lot of supplys, she notes that after all the heavy activity that she presents with a C-sign on her L hip. Pt. occasionally has this pain if she is able to be extremely active. PT. will assess potential causes of her C sign pattern next treatment session.    Examination-Activity Limitations Bend;Sleep;Squat;Stairs    Examination-Participation Restrictions Community Activity    Stability/Clinical Decision Making Evolving/Moderate complexity    Clinical Decision Making Moderate    Rehab Potential Fair    PT Frequency 2x / week    PT Duration 8 weeks    PT Treatment/Interventions ADLs/Self Care Home Management;Moist Heat;Cryotherapy;Gait training;Functional mobility training;Therapeutic activities;Therapeutic exercise;Neuromuscular re-education;Manual techniques;Passive range of motion;Patient/family education;Balance training;Stair training;Electrical Stimulation    PT Next Visit Plan Progress to eccentric strengthening in standing    PT Home Exercise Plan Access Code: [email or web address]             Patient will benefit from skilled therapeutic intervention in order to improve the following deficits and impairments:  Decreased endurance, Decreased activity tolerance,  Impaired perceived functional ability, Pain, Decreased strength, Decreased range of motion, Decreased mobility  Visit Diagnosis: Greater trochanteric bursitis of left hip  Pain of left lower extremity     Problem List  Patient Active Problem List   Diagnosis Date Noted   Greater trochanteric pain syndrome of left lower extremity 06/27/2021   Primary osteoarthritis of left hip 06/27/2021   Prediabetes 03/29/2021   Osteopenia determined by x-ray 03/29/2021   Hypercholesterolemia 09/30/2013   Nephrolithiasis 09/30/2013   Double voiding 04/15/2013   Incomplete emptying of bladder 04/09/2012   Cammie Mcgee, PT, DPT # [id number] Bo Merino, SPT 07/26/2021, 10:34 AM  [place] [facility] [address] [place], [place], [address] Phone: [phone]   Fax:  [phone]  Name: Gabriela Mullins MRN: [id number] Date of Birth: 1956-02-16

## 2021-07-26 NOTE — Patient Instructions (Signed)
Access Code: [email or web address] URL: [email or web address] Date: 07/26/2021 Prepared by: Dorene Grebe  Exercises Supine Hamstring Stretch with Strap - 1 x daily - 3 x weekly - 3 sets - 3 reps - 20 hold Supine ITB Stretch - 1 x daily - 3 x weekly - 3 sets - 3 reps - 20 hold Isometric Gluteus Medius at Wall - 1 x daily - 3 x weekly - 1 sets - 10 reps - 10 hold Hooklying Clamshell with Resistance - 1 x daily - 3 x weekly - 2 sets - 8 reps Step Downs - 1 x daily - 3 x weekly - 2 sets - 6 reps Standing Hip Extension at Wall - 1 x daily - 3 x weekly - 2 sets - 10 reps

## 2021-07-31 ENCOUNTER — Other Ambulatory Visit: Payer: Self-pay

## 2021-07-31 ENCOUNTER — Ambulatory Visit: Payer: Medicare PPO | Admitting: Physical Therapy

## 2021-07-31 ENCOUNTER — Telehealth: Payer: Self-pay | Admitting: Internal Medicine

## 2021-07-31 ENCOUNTER — Encounter: Payer: Self-pay | Admitting: Physical Therapy

## 2021-07-31 DIAGNOSIS — M79605 Pain in left leg: Secondary | ICD-10-CM | POA: Diagnosis not present

## 2021-07-31 DIAGNOSIS — M7062 Trochanteric bursitis, left hip: Secondary | ICD-10-CM | POA: Diagnosis not present

## 2021-07-31 DIAGNOSIS — R29898 Other symptoms and signs involving the musculoskeletal system: Secondary | ICD-10-CM | POA: Diagnosis not present

## 2021-07-31 NOTE — Therapy (Addendum)
[facility] [address] [place], [place], [address] Phone: [phone]   Fax:  [phone]  Physical Therapy Treatment  Patient Details  Name: Gabriela Mullins MRN: [id number] Date of Birth: 30-Jun-1955 Referring Provider (PT): Dr. Ashley Royalty   Encounter Date: 07/31/2021   PT End of Session - 07/31/21 0824     Visit Number 8    Number of Visits 12    Date for PT Re-Evaluation 08/28/21    Authorization - Visit Number 8    Authorization - Number of Visits 10    PT Start Time 0726    PT Stop Time 0814    PT Time Calculation (min) 48 min    Activity Tolerance Patient tolerated treatment well    Behavior During Therapy [facility] for tasks assessed/performed             Past Medical History:  Diagnosis Date   Hyperlipidemia    Nephrolithiasis    Osteopenia     Past Surgical History:  Procedure Laterality Date   CHOLECYSTECTOMY  1990   EXTRACORPOREAL SHOCK WAVE LITHOTRIPSY     TONSILLECTOMY AND ADENOIDECTOMY     URETEROLITHOTOMY  1970   WISDOM TOOTH EXTRACTION Bilateral     There were no vitals filed for this visit.   Subjective Assessment - 07/31/21 0741     Subjective Pt. arrvies to clinic with no L hip pain after a relaxing weekend. Pt. has started taking the Meloxicam ever other day starting 07/30/21 and wants to see if there is any change in how she is currently feeling.    Pertinent History Pt. lives an active lifestyle and wants to get back to the gym as soon as possible. Pt. does not have a history of hip pain but had intial onset 3-4 months ago. Pt. had some worry and acceptance that it was normal arthritis of L hip but pain became unbearable. Pt. was unable to sleep as the pain was too much and had a positive outlook during the eval as she was able to get good sleep post corticosteroid shot.  Pt. lives at home with husband and is looking to progress to 1x/week PT and become self sufficient.    Limitations Walking     Patient Stated Goals Pt. would like to get back to working out on her own and get out of PT as quick as possible.    Currently in Pain? No/denies    Pain Score 0-No pain    Pain Location Hip    Pain Orientation Left               Therex.   Nustep B UE/LE L4 for 10 minutes (consistent cadence/ no pain) Sidelying clam shell with GTB 2x10. Pt. Isolates L glute med. Standing step down from 6" step. Eccentric loading of L hip. 2x5 Standing in //-bars hip extension/flexion. 10x with 2# ankle wts.     Manual therapy:   Supine Hamstring/ gastroc/ piriformis stretches. 3x20-30 seconds each Supine Hip mobs A-P 3x20 second bouts grade II-III Supine LAD. To hip 3x20 seconds each. Pt. Notes  Special tests: FADDIR-negative (Pt. Notes she feels no pain in her hip) Felxion-Axial compression: negative     PT Long Term Goals - 07/04/21 0942       PT LONG TERM GOAL #1   Title Pt. will improve FOTO score to >79 to improve pain free functional mobility.    Baseline 1/30:78    Time 8  Period Weeks    Status New    Target Date 08/28/21      PT LONG TERM GOAL #2   Title Pt. will increase L LE muscle strength 1/2 muscle grade to allow for her to perform all ADLs with less leg pain and fatigue.    Baseline Hip Flexion (4+*/4),  Hip Extension (4+*/ 4),  Hip IR (5/ 4*),  Hip ER (5/ 4*),  Hip abd (4+/4*)    Time 8    Period Weeks    Status New    Target Date 08/28/21      PT LONG TERM GOAL #3   Title Pt. will return to normal gym routine of 2-3 days per week while being self suffcient and not limited by hip pain.    Baseline Pt. is currently avoiding gym program with fear of increasing L hip pain or making it worse.    Time 8    Period Weeks    Status New    Target Date 08/28/21      PT LONG TERM GOAL #4   Title Pt will be independent with HEP in to maintain L Hip strength and keep pain down to a 2/10 NPS in order to improve pain-free function during ADLs.    Baseline worse NPS:  8/10    Time 8    Period Weeks    Status New    Target Date 08/28/21                   Plan - 07/31/21 0824     Clinical Impression Statement Pt. is continuing to progress to more dynamic and standing L LE exercises as she has not had any notable L hip pain for the past 2 weeks. Pt. is taking Meloxicam every other day as she does not want to fully rely on it to decrease her L hip pain. Pt. states she feels PT is helping her pain but still has some yellow flags with her movement patterns, she often references a "hitch" when she is flexing or extending her L hip that can make her hesitant to move. PT performed LE special tests to check for potential L hip labral pathology (see flowsheet) after pt referenced C sign during previous visit. Pt. notes no increase in L hip pain during screening tests. Pt. is making steady progress in her ability to move without fear of pain and is planning on returning to her gym routine in the coming week. Pt. responds well to LE stretching and Hip grade III mobs at the beginning of the session and will continue to progress to more strengthening exercises as she tolerates.    Examination-Activity Limitations Bend;Sleep;Squat;Stairs    Examination-Participation Restrictions Community Activity    Stability/Clinical Decision Making Evolving/Moderate complexity    Clinical Decision Making Moderate    Rehab Potential Fair    PT Frequency 2x / week    PT Duration 8 weeks    PT Treatment/Interventions ADLs/Self Care Home Management;Moist Heat;Cryotherapy;Gait training;Functional mobility training;Therapeutic activities;Therapeutic exercise;Neuromuscular re-education;Manual techniques;Passive range of motion;Patient/family education;Balance training;Stair training;Electrical Stimulation    PT Next Visit Plan Progress to eccentric strengthening in standing    PT Home Exercise Plan Access Code: [email or web address]             Patient will benefit from skilled therapeutic  intervention in order to improve the following deficits and impairments:  Decreased endurance, Decreased activity tolerance, Impaired perceived functional ability, Pain, Decreased strength, Decreased range of motion, Decreased mobility  Visit  Diagnosis: Greater trochanteric bursitis of left hip  Pain of left lower extremity     Problem List Patient Active Problem List   Diagnosis Date Noted   Greater trochanteric pain syndrome of left lower extremity 06/27/2021   Primary osteoarthritis of left hip 06/27/2021   Prediabetes 03/29/2021   Osteopenia determined by x-ray 03/29/2021   Hypercholesterolemia 09/30/2013   Nephrolithiasis 09/30/2013   Double voiding 04/15/2013   Incomplete emptying of bladder 04/09/2012   Cammie Mcgee, PT, DPT # [id number] Bo Merino, SPT 07/31/2021, 9:13 AM  [place] [facility] [address] [place], [place], [address] Phone: [phone]   Fax:  [phone]  Name: MAKYNNA MANOCCHIO MRN: [id number] Date of Birth: [birth year]

## 2021-07-31 NOTE — Telephone Encounter (Signed)
Copied from [place] [id number]. Topic: Medicare AWV >> Jul 31, 2021  2:10 PM Cher Nakai R wrote: Reason for CRM:  Left message for patient to call back and schedule Medicare Annual Wellness Visit (AWV) in office.   If unable to come into the office for AWV,  please offer to do virtually or by telephone.  No hx of AWV eligible for AWVI per [employer] as of 09/02/2020  Please schedule at anytime with [facility] Advisor.      40 Minutes appointment   Any questions, please call me at [phone]

## 2021-08-02 ENCOUNTER — Ambulatory Visit: Payer: Medicare PPO | Attending: Family Medicine | Admitting: Physical Therapy

## 2021-08-02 ENCOUNTER — Encounter: Payer: Self-pay | Admitting: Physical Therapy

## 2021-08-02 ENCOUNTER — Other Ambulatory Visit: Payer: Self-pay

## 2021-08-02 DIAGNOSIS — R29898 Other symptoms and signs involving the musculoskeletal system: Secondary | ICD-10-CM | POA: Insufficient documentation

## 2021-08-02 DIAGNOSIS — M7062 Trochanteric bursitis, left hip: Secondary | ICD-10-CM | POA: Insufficient documentation

## 2021-08-02 DIAGNOSIS — M79605 Pain in left leg: Secondary | ICD-10-CM | POA: Diagnosis not present

## 2021-08-02 NOTE — Therapy (Addendum)
[place] [facility] ?[address]. [place], [place], [address] ?Phone: [phone]   Fax:  [phone] ? ?Physical Therapy Treatment ? ?Patient Details  ?Name: Gabriela Mullins ?MRN: [id number] ?Date of Birth: December 24, 1955 ?Referring Provider (PT): Dr. Zigmund Daniel ? ? ?Encounter Date: 08/02/2021 ? ? PT End of Session - 08/02/21 0910   ? ? Visit Number 9   ? Number of Visits 12   ? Date for PT Re-Evaluation 08/28/21   ? Authorization - Visit Number 9   ? Authorization - Number of Visits 10   ? PT Start Time 0813   ? PT Stop Time 5638   ? PT Time Calculation (min) 52 min   ? Activity Tolerance Patient tolerated treatment well   ? Behavior During Therapy [facility] for tasks assessed/performed   ? ?  ?  ? ?  ? ? ?Past Medical History:  ?Diagnosis Date  ? Hyperlipidemia   ? Nephrolithiasis   ? Osteopenia   ? ? ?Past Surgical History:  ?Procedure Laterality Date  ? CHOLECYSTECTOMY  1990  ? EXTRACORPOREAL SHOCK WAVE LITHOTRIPSY    ? TONSILLECTOMY AND ADENOIDECTOMY    ? URETEROLITHOTOMY  1970  ? WISDOM TOOTH EXTRACTION Bilateral   ? ? ?There were no vitals filed for this visit. ? ? Subjective Assessment - 08/02/21 0814   ? ? Subjective Pt. states she has a slight increase in L hip pain since she starting to take Meloxicam every other day. Pt. is ready to return to her gym program and decrease her PT frequency.   ? Pertinent History Pt. lives an active lifestyle and wants to get back to the gym as soon as possible. Pt. does not have a history of hip pain but had intial onset 3-4 months ago. Pt. had some worry and acceptance that it was normal arthritis of L hip but pain became unbearable. Pt. was unable to sleep as the pain was too much and had a positive outlook during the eval as she was able to get good sleep post corticosteroid shot.  Pt. lives at home with husband and is looking to progress to 1x/week PT and become self sufficient.   ? Limitations Walking   ? Patient Stated Goals Pt. would like  to get back to working out on her own and get out of PT as quick as possible.   ? Currently in Pain? Yes   ? Pain Score 3    ? Pain Location Hip   ? Pain Orientation Left   ? Pain Descriptors / Indicators Aching   ? ?  ?  ? ?  ? ?Ther.ex.: ? ?Scifit L6 B UE/LE for 10 minutes (consistent cadence/ no pain) ?Standing 3 way hip in //-bars with 2# ankle wts. 20x each ?Standing step down from 6" step. Eccentric loading of L hip. 20x ?Hooklying glute bridge/ ball squeezes. 20x each  ?  ?  ?Manual therapy: ?  ?Supine Hamstring/ gastroc/ piriformis/ hip adductor stretches. 3x20-30 seconds each ?Supine Hip mobs A-P 3x20 second bouts grade II-III ?Supine LAD. To hip 3x20 seconds each. ? ?MMT (R/L) ?5/5 Hip abduction ?5/5 Hip adduction ?4+/4 Hip flexion ?5/4+ Hip ER ?5/5 Hip IR ?5/5 Knee extension ?5/4+ Knee flexion  ? ? ? ? PT Long Term Goals - 08/02/21 0929   ? ?  ? PT LONG TERM GOAL #1  ? Title Pt. will improve FOTO score to >79 to improve pain free functional mobility.   ? Baseline 1/30:78   ?  Time 8   ? Period Weeks   ? Status Partially Met   ? Target Date 08/28/21   ?  ? PT LONG TERM GOAL #2  ? Title Pt. will increase L LE muscle strength 1/2 muscle grade to allow for her to perform all ADLs with less leg pain and fatigue.   ? Baseline Hip Flexion (4+*/4),  Hip Extension (4+*/ 4),  Hip IR (5/ 4*),  Hip ER (5/ 4*),  Hip abd (4+/4*)   ? Time 8   ? Period Weeks   ? Status Partially Met   ? Target Date 08/28/21   ?  ? PT LONG TERM GOAL #3  ? Title Pt. will return to normal gym routine of 2-3 days per week while being self suffcient and not limited by hip pain.   ? Baseline Pt. is currently avoiding gym program with fear of increasing L hip pain or making it worse.   ? Time 8   ? Period Weeks   ? Status Partially Met   ? Target Date 08/28/21   ?  ? PT LONG TERM GOAL #4  ? Title Pt will be independent with HEP in to maintain L Hip strength and keep pain down to a 2/10 NPS in order to improve pain-free function during ADLs.   ?  Baseline worse NPS: 8/10   ? Time 8   ? Period Weeks   ? Status Achieved   ? Target Date 08/28/21   ? ?  ?  ? ?  ? ? ? ? ? ? ? ? Plan - 08/02/21 0910   ? ? Clinical Impression Statement Pt. is continuing to make progress in her ability to perform functional activities in the clinic. Pt. notes some tenderness and a general increase in L hip after starting to take Meloxicam every other day. Pt. wants to return to her gym program next week and decrease her frequency with PT. Pt. is educated on returning to gym program and starting with low load while moving through a pain free range during all exercises. Pt. is continuing to benefit from icing protocol at home. Pt. demonstrates gross increase in LE strength (see flowsheet) but does still have strength deficits in L hip flexion. Pt. will reach out next week to inform us how she is tolerating the gym program and determine her POC from there.   ? Examination-Activity Limitations Bend;Sleep;Squat;Stairs   ? Examination-Participation Restrictions Community Activity   ? Stability/Clinical Decision Making Evolving/Moderate complexity   ? Clinical Decision Making Moderate   ? Rehab Potential Fair   ? PT Frequency 2x / week   ? PT Duration 8 weeks   ? PT Treatment/Interventions ADLs/Self Care Home Management;Moist Heat;Cryotherapy;Gait training;Functional mobility training;Therapeutic activities;Therapeutic exercise;Neuromuscular re-education;Manual techniques;Passive range of motion;Patient/family education;Balance training;Stair training;Electrical Stimulation   ? PT Next Visit Plan Progress to eccentric strengthening in standing   ? PT Home Exercise Plan Access Code: [email or web address]   ? ?  ?  ? ?  ? ? ?Patient will benefit from skilled therapeutic intervention in order to improve the following deficits and impairments:  Decreased endurance, Decreased activity tolerance, Impaired perceived functional ability, Pain, Decreased strength, Decreased range of motion, Decreased  mobility ? ?Visit Diagnosis: ?Greater trochanteric bursitis of left hip ? ?Pain of left lower extremity ? ?Weakness of left lower extremity ? ? ? ? ?Problem List ?Patient Active Problem List  ? Diagnosis Date Noted  ? Greater trochanteric pain syndrome of left lower extremity 06/27/2021  ? Primary  osteoarthritis of left hip 06/27/2021  ? Prediabetes 03/29/2021  ? Osteopenia determined by x-ray 03/29/2021  ? Hypercholesterolemia 09/30/2013  ? Nephrolithiasis 09/30/2013  ? Double voiding 04/15/2013  ? Incomplete emptying of bladder 04/09/2012  ? ?Pura Spice, PT, DPT # [id number] ?Cleopatra Cedar, SPT ?08/02/2021, 9:41 AM ? ?[place] ?[facility] ?[address] [place], [place], [address] ?Phone: [phone]   Fax:  [phone] ? ?Name: SHAWNISE PETERKIN ?MRN: [id number] ?Date of Birth: 1955-11-24 ? ? ? ?

## 2021-08-07 ENCOUNTER — Encounter: Payer: Medicare PPO | Admitting: Physical Therapy

## 2021-08-08 ENCOUNTER — Encounter: Payer: Self-pay | Admitting: Physical Therapy

## 2021-08-09 ENCOUNTER — Encounter: Payer: Medicare PPO | Admitting: Physical Therapy

## 2021-08-14 ENCOUNTER — Encounter: Payer: Medicare PPO | Admitting: Physical Therapy

## 2021-08-16 ENCOUNTER — Encounter: Payer: Medicare PPO | Admitting: Physical Therapy

## 2021-08-25 ENCOUNTER — Other Ambulatory Visit: Payer: Self-pay

## 2021-08-25 ENCOUNTER — Encounter: Payer: Self-pay | Admitting: Internal Medicine

## 2021-08-25 ENCOUNTER — Telehealth: Payer: Self-pay | Admitting: Internal Medicine

## 2021-08-25 ENCOUNTER — Other Ambulatory Visit: Payer: Self-pay | Admitting: Internal Medicine

## 2021-08-25 MED ORDER — POTASSIUM CITRATE ER 10 MEQ (1080 MG) PO TBCR: 10.0000 meq | EXTENDED_RELEASE_TABLET | Freq: Two times a day (BID) | ORAL | 0 refills | Status: DC

## 2021-08-25 MED ORDER — HYDROCHLOROTHIAZIDE 50 MG PO TABS: 50.0000 mg | ORAL_TABLET | Freq: Every day | ORAL | 0 refills | Status: DC

## 2021-08-25 MED ORDER — ATORVASTATIN CALCIUM 10 MG PO TABS: 10.0000 mg | ORAL_TABLET | Freq: Every day | ORAL | 0 refills | Status: DC

## 2021-08-25 NOTE — Telephone Encounter (Signed)
Pt is calling to request the following scripts. Pt was advised that an appt would be need since these scripts where not written by Dr. Judithann Graves. Pt declined appt. And requested a message to be sent to Dr. Judithann Graves. ? ? ?Medication: hydrochlorothiazide (HYDRODIURIL) 50 MG tablet [[id number]], potassium citrate (UROCIT-K) 10 MEQ (1080 MG) SR tablet [[id number], atorvastatin (LIPITOR) 10 MG tablet [[id number]]  ? ?Has the patient contacted their pharmacy? NO  ?(Agent: If no, request that the patient contact the pharmacy for the refill. If patient does not wish to contact the pharmacy document the reason why and proceed with request.) ?(Agent: If yes, when and what did the pharmacy advise?) ? ?Preferred Pharmacy (with phone number or street name): [facility] [address] [place], [place] - [address] ?[address] [place] [address] ?Phone: [phone] Fax: [phone] ?Hours: Not open 24 hours ? ? ?Has the patient been seen for an appointment in the last year OR does the patient have an upcoming appointment? YES 09/06/21  ? ?Agent: Please be advised that RX refills may take up to 3 business days. We ask that you follow-up with your pharmacy. ?

## 2021-08-28 NOTE — Telephone Encounter (Signed)
Medication refills sent to pharmacy on 08/25/21.  ?

## 2021-08-30 ENCOUNTER — Other Ambulatory Visit: Payer: Self-pay | Admitting: Nurse Practitioner

## 2021-08-30 ENCOUNTER — Other Ambulatory Visit: Payer: Self-pay | Admitting: Internal Medicine

## 2021-08-30 DIAGNOSIS — Z1231 Encounter for screening mammogram for malignant neoplasm of breast: Secondary | ICD-10-CM

## 2021-09-05 NOTE — Progress Notes (Addendum)
Date:  09/06/2021   Name:  Gabriela Mullins   DOB:  01/19/1956   MRN:  [id number]   Chief Complaint: Annual Exam (Breast Exam. ) Gabriela Mullins is a 66 y.o. female who presents today for her Complete Annual Exam. She feels well. She reports exercising- going to the [facility] 2x weekly. She reports she is sleeping fairly well. Breast complaints - none.  Mammogram: 09/2020 - scheduled for 10/05/21 DEXA: 09/2020 osteopenia Colonoscopy: 2018  Health Maintenance Due  Topic Date Due   Hepatitis C Screening  Never done    Immunization History  Administered Date(s) Administered   Fluad Quad(high Dose 65+) 03/29/2021   Influenza Inj Mdck Quad Pf 02/20/2019   Influenza,inj,Quad PF,6+ Mos 03/04/2020   Moderna Sars-Covid-2 Vaccination 11/18/2019, 12/16/2019   PNEUMOCOCCAL CONJUGATE-20 03/29/2021   Tdap 08/04/2018   Zoster Recombinat (Shingrix) 04/09/2019, 07/14/2019    Hyperlipidemia This is a chronic problem. The problem is controlled. Pertinent negatives include no chest pain or shortness of breath. Current antihyperlipidemic treatment includes statins.  Diabetes She presents for her follow-up diabetic visit. Diabetes type: prediabetes. Her disease course has been stable. There are no hypoglycemic associated symptoms. Pertinent negatives for hypoglycemia include no dizziness, headaches, nervousness/anxiousness or tremors. Pertinent negatives for diabetes include no chest pain, no fatigue, no polydipsia and no polyuria.  Vaginal bleeding - she has had mild intermittent spotting for several years.  Previous PCP did a pelvic and [place] with no source seen.  She denies pain, itching, odor or injury.  Lab Results  Component Value Date   BUN 20 09/02/2020   CREATININE 0.7 09/02/2020   Lab Results  Component Value Date   CHOL 170 09/22/2020   HDL 57 09/22/2020   LDLCALC 86 09/22/2020   TRIG 138 09/22/2020   No results found for: TSH Lab Results  Component Value Date   HGBA1C 5.5 09/22/2020   No  results found for: WBC, HGB, HCT, MCV, PLT Lab Results  Component Value Date   ALT 22 09/02/2020   AST 18 09/02/2020   No results found for: 25OHVITD2, 25OHVITD3, VD25OH   Review of Systems  Constitutional:  Negative for chills, fatigue and fever.  HENT:  Negative for congestion, hearing loss, tinnitus, trouble swallowing and voice change.   Eyes:  Negative for visual disturbance.  Respiratory:  Negative for cough, chest tightness, shortness of breath and wheezing.   Cardiovascular:  Negative for chest pain, palpitations and leg swelling.  Gastrointestinal:  Negative for abdominal pain, constipation, diarrhea and vomiting.  Endocrine: Negative for polydipsia and polyuria.  Genitourinary:  Positive for vaginal bleeding. Negative for dysuria, frequency, genital sores and vaginal discharge.  Musculoskeletal:  Negative for arthralgias, gait problem and joint swelling.  Skin:  Negative for color change and rash.  Neurological:  Negative for dizziness, tremors, light-headedness and headaches.  Hematological:  Negative for adenopathy. Does not bruise/bleed easily.  Psychiatric/Behavioral:  Negative for dysphoric mood and sleep disturbance. The patient is not nervous/anxious.    Patient Active Problem List   Diagnosis Date Noted   Greater trochanteric pain syndrome of left lower extremity 06/27/2021   Primary osteoarthritis of left hip 06/27/2021   Prediabetes 03/29/2021   Osteopenia determined by x-ray 03/29/2021   Hypercholesterolemia 09/30/2013   Nephrolithiasis 09/30/2013   Double voiding 04/15/2013   Incomplete emptying of bladder 04/09/2012    Allergies  Allergen Reactions   Clindamycin Hives   Levofloxacin Hives   Lidocaine Hives   Penicillins Hives   Tetracycline  Hives    Past Surgical History:  Procedure Laterality Date   CHOLECYSTECTOMY  1990   EXTRACORPOREAL SHOCK WAVE LITHOTRIPSY     TONSILLECTOMY AND ADENOIDECTOMY     URETEROLITHOTOMY  1970   WISDOM TOOTH  EXTRACTION Bilateral     Social History   Tobacco Use   Smoking status: Never   Smokeless tobacco: Never  Vaping Use   Vaping Use: Never used  Substance Use Topics   Alcohol use: Not Currently   Drug use: Never     Medication list has been reviewed and updated.  Current Meds  Medication Sig   atorvastatin (LIPITOR) 10 MG tablet Take 1 tablet (10 mg total) by mouth daily.   Ferrous Sulfate (IRON PO) Take 1 tablet by mouth daily.   hydrochlorothiazide (HYDRODIURIL) 50 MG tablet Take 1 tablet (50 mg total) by mouth daily.   meloxicam (MOBIC) 15 MG tablet Take 1 tablet (15 mg total) by mouth daily as needed for pain.   potassium citrate (UROCIT-K) 10 MEQ (1080 MG) SR tablet Take 1 tablet (10 mEq total) by mouth 2 (two) times daily with a meal.       06/29/2021    9:53 AM 06/27/2021    9:50 AM 03/29/2021    2:58 PM  GAD 7 : Generalized Anxiety Score  Nervous, Anxious, on Edge 0 0 0  Control/stop worrying 0 0 0  Worry too much - different things 0 0 0  Trouble relaxing 0 0 0  Restless 0 0 0  Easily annoyed or irritable 0 0 0  Afraid - awful might happen 0 0 0  Total GAD 7 Score 0 0 0  Anxiety Difficulty Not difficult at all Not difficult at all Not difficult at all       06/29/2021    9:53 AM  Depression screen PHQ 2/9  Decreased Interest 0  Down, Depressed, Hopeless 0  PHQ - 2 Score 0  Altered sleeping 1  Tired, decreased energy 0  Change in appetite 0  Feeling bad or failure about yourself  0  Trouble concentrating 0  Moving slowly or fidgety/restless 0  Suicidal thoughts 0  PHQ-9 Score 1  Difficult doing work/chores Not difficult at all    BP Readings from Last 3 Encounters:  09/06/21 124/70  06/29/21 108/82  06/27/21 112/78    Physical Exam Vitals and nursing note reviewed.  Constitutional:      General: She is not in acute distress.    Appearance: She is well-developed.  HENT:     Head: Normocephalic and atraumatic.     Right Ear: Tympanic  membrane and ear canal normal.     Left Ear: Tympanic membrane and ear canal normal.     Nose:     Right Sinus: No maxillary sinus tenderness.     Left Sinus: No maxillary sinus tenderness.  Eyes:     General: No scleral icterus.       Right eye: No discharge.        Left eye: No discharge.     Conjunctiva/sclera: Conjunctivae normal.  Neck:     Thyroid: No thyromegaly.     Vascular: No carotid bruit.  Cardiovascular:     Rate and Rhythm: Normal rate and regular rhythm.     Pulses: Normal pulses.     Heart sounds: Normal heart sounds.  Pulmonary:     Effort: Pulmonary effort is normal. No respiratory distress.     Breath sounds: No wheezing.  Chest:  Breasts:    Right: No mass, nipple discharge, skin change or tenderness.     Left: No mass, nipple discharge, skin change or tenderness.  Abdominal:     General: Bowel sounds are normal.     Palpations: Abdomen is soft.     Tenderness: There is no abdominal tenderness.  Musculoskeletal:     Cervical back: Normal range of motion. No erythema.     Right lower leg: No edema.     Left lower leg: No edema.  Lymphadenopathy:     Cervical: No cervical adenopathy.  Skin:    General: Skin is warm and dry.     Findings: No rash.  Neurological:     Mental Status: She is alert and oriented to person, place, and time.     Cranial Nerves: No cranial nerve deficit.     Sensory: No sensory deficit.     Deep Tendon Reflexes: Reflexes are normal and symmetric.  Psychiatric:        Attention and Perception: Attention normal.        Mood and Affect: Mood normal.    Wt Readings from Last 3 Encounters:  09/06/21 178 lb 3.2 oz (80.8 kg)  06/29/21 179 lb (81.2 kg)  06/27/21 180 lb (81.6 kg)    BP 124/70   Pulse 80   Ht 5\' 8"  (1.727 m)   Wt 178 lb 3.2 oz (80.8 kg)   SpO2 94%   BMI 27.10 kg/m   Assessment and Plan: 1. Annual physical exam Normal exam. Up to date on screenings and immunizations. - CBC with Differential/Platelet -  TSH  2. Hypercholesterolemia Tolerating statin medication without side effects at this time LDL is at goal of < 70 on current dose Continue same therapy without change at this time. - Lipid panel - atorvastatin (LIPITOR) 10 MG tablet; Take 1 tablet (10 mg total) by mouth daily.  Dispense: 90 tablet; Refill: 1  3. Prediabetes Continue healthy diet and regular exercise - Comprehensive metabolic panel - Hemoglobin A1c - POCT urinalysis dipstick  4. Need for hepatitis C screening test - Hepatitis C antibody  5. Osteopenia determined by x-ray Check labs and advise - VITAMIN D 25 Hydroxy (Vit-D Deficiency, Fractures)  6. Primary osteoarthritis of left hip Continue Mobic PRN - can use daily intermittently Will check renal function  7. Greater trochanteric pain syndrome of left lower extremity Being seen by Dr. Ashley Royalty  8. Vaginal bleeding Intermittent and long standing per report Recommend that she call for a same day appointment the next time it occurs so we can do an exam   Partially dictated using Dragon software. Any errors are unintentional.  Bari Edward, MD [facility]  09/06/2021

## 2021-09-06 ENCOUNTER — Encounter: Payer: Self-pay | Admitting: Internal Medicine

## 2021-09-06 ENCOUNTER — Ambulatory Visit (INDEPENDENT_AMBULATORY_CARE_PROVIDER_SITE_OTHER): Payer: Medicare PPO | Admitting: Internal Medicine

## 2021-09-06 VITALS — BP 124/70 | HR 80 | Ht 68.0 in | Wt 178.2 lb

## 2021-09-06 DIAGNOSIS — Z0000 Encounter for general adult medical examination without abnormal findings: Secondary | ICD-10-CM | POA: Diagnosis not present

## 2021-09-06 DIAGNOSIS — Z1231 Encounter for screening mammogram for malignant neoplasm of breast: Secondary | ICD-10-CM

## 2021-09-06 DIAGNOSIS — N939 Abnormal uterine and vaginal bleeding, unspecified: Secondary | ICD-10-CM

## 2021-09-06 DIAGNOSIS — Z1159 Encounter for screening for other viral diseases: Secondary | ICD-10-CM

## 2021-09-06 DIAGNOSIS — E7800 Pure hypercholesterolemia, unspecified: Secondary | ICD-10-CM | POA: Diagnosis not present

## 2021-09-06 DIAGNOSIS — M1612 Unilateral primary osteoarthritis, left hip: Secondary | ICD-10-CM | POA: Diagnosis not present

## 2021-09-06 DIAGNOSIS — M25552 Pain in left hip: Secondary | ICD-10-CM

## 2021-09-06 DIAGNOSIS — M8580 Other specified disorders of bone density and structure, unspecified site: Secondary | ICD-10-CM

## 2021-09-06 DIAGNOSIS — R7303 Prediabetes: Secondary | ICD-10-CM | POA: Diagnosis not present

## 2021-09-06 LAB — POCT URINALYSIS DIPSTICK
Bilirubin, UA: NEGATIVE
Blood, UA: NEGATIVE
Glucose, UA: NEGATIVE
Ketones, UA: NEGATIVE
Leukocytes, UA: NEGATIVE
Nitrite, UA: NEGATIVE
Odor: NEGATIVE
Protein, UA: NEGATIVE
Spec Grav, UA: 1.01 (ref 1.010–1.025)
Urobilinogen, UA: 0.2 E.U./dL
pH, UA: 6.5 (ref 5.0–8.0)

## 2021-09-06 MED ORDER — ATORVASTATIN CALCIUM 10 MG PO TABS: 10.0000 mg | ORAL_TABLET | Freq: Every day | ORAL | 1 refills | Status: DC

## 2021-09-06 MED ORDER — HYDROCHLOROTHIAZIDE 50 MG PO TABS: 50.0000 mg | ORAL_TABLET | Freq: Every day | ORAL | 1 refills | Status: DC

## 2021-09-06 MED ORDER — POTASSIUM CITRATE ER 10 MEQ (1080 MG) PO TBCR: 10.0000 meq | EXTENDED_RELEASE_TABLET | Freq: Two times a day (BID) | ORAL | 1 refills | Status: DC

## 2021-09-06 NOTE — Patient Instructions (Signed)
Dermatology check up ?

## 2021-09-07 LAB — HEPATITIS C ANTIBODY: Hep C Virus Ab: NONREACTIVE

## 2021-09-07 LAB — CBC WITH DIFFERENTIAL/PLATELET
Basophils Absolute: 0.1 10*3/uL (ref 0.0–0.2)
Basos: 1 %
EOS (ABSOLUTE): 0.2 10*3/uL (ref 0.0–0.4)
Eos: 4 %
Hematocrit: 45.6 % (ref 34.0–46.6)
Hemoglobin: 15.6 g/dL (ref 11.1–15.9)
Immature Grans (Abs): 0 10*3/uL (ref 0.0–0.1)
Immature Granulocytes: 1 %
Lymphocytes Absolute: 2.1 10*3/uL (ref 0.7–3.1)
Lymphs: 32 %
MCH: 29.8 pg (ref 26.6–33.0)
MCHC: 34.2 g/dL (ref 31.5–35.7)
MCV: 87 fL (ref 79–97)
Monocytes Absolute: 0.7 10*3/uL (ref 0.1–0.9)
Monocytes: 10 %
Neutrophils Absolute: 3.5 10*3/uL (ref 1.4–7.0)
Neutrophils: 52 %
Platelets: 349 10*3/uL (ref 150–450)
RBC: 5.24 x10E6/uL (ref 3.77–5.28)
RDW: 12.2 % (ref 11.7–15.4)
WBC: 6.6 10*3/uL (ref 3.4–10.8)

## 2021-09-07 LAB — COMPREHENSIVE METABOLIC PANEL
ALT: 17 IU/L (ref 0–32)
AST: 18 IU/L (ref 0–40)
Albumin/Globulin Ratio: 2 (ref 1.2–2.2)
Albumin: 4.7 g/dL (ref 3.8–4.8)
Alkaline Phosphatase: 75 IU/L (ref 44–121)
BUN/Creatinine Ratio: 22 (ref 12–28)
BUN: 17 mg/dL (ref 8–27)
Bilirubin Total: 0.9 mg/dL (ref 0.0–1.2)
CO2: 26 mmol/L (ref 20–29)
Calcium: 10.3 mg/dL (ref 8.7–10.3)
Chloride: 100 mmol/L (ref 96–106)
Creatinine, Ser: 0.78 mg/dL (ref 0.57–1.00)
Globulin, Total: 2.4 g/dL (ref 1.5–4.5)
Glucose: 101 mg/dL — ABNORMAL HIGH (ref 70–99)
Potassium: 3.6 mmol/L (ref 3.5–5.2)
Sodium: 141 mmol/L (ref 134–144)
Total Protein: 7.1 g/dL (ref 6.0–8.5)
eGFR: 83 mL/min/{1.73_m2} (ref >59)

## 2021-09-07 LAB — LIPID PANEL
Chol/HDL Ratio: 2.7 ratio (ref 0.0–4.4)
Cholesterol, Total: 186 mg/dL (ref 100–199)
HDL: 68 mg/dL (ref >39)
LDL Chol Calc (NIH): 99 mg/dL (ref 0–99)
Triglycerides: 109 mg/dL (ref 0–149)
VLDL Cholesterol Cal: 19 mg/dL (ref 5–40)

## 2021-09-07 LAB — VITAMIN D 25 HYDROXY (VIT D DEFICIENCY, FRACTURES): Vit D, 25-Hydroxy: 35.5 ng/mL (ref 30.0–100.0)

## 2021-09-07 LAB — HEMOGLOBIN A1C
Est. average glucose Bld gHb Est-mCnc: 120 mg/dL
Hgb A1c MFr Bld: 5.8 % — ABNORMAL HIGH (ref 4.8–5.6)

## 2021-09-07 LAB — TSH: TSH: 1.39 u[IU]/mL (ref 0.450–4.500)

## 2021-09-14 ENCOUNTER — Encounter: Payer: Self-pay | Admitting: Internal Medicine

## 2021-09-27 ENCOUNTER — Encounter: Payer: Medicare PPO | Admitting: Internal Medicine

## 2021-10-02 ENCOUNTER — Ambulatory Visit (INDEPENDENT_AMBULATORY_CARE_PROVIDER_SITE_OTHER): Payer: Medicare PPO

## 2021-10-02 DIAGNOSIS — Z0000 Encounter for general adult medical examination without abnormal findings: Secondary | ICD-10-CM | POA: Diagnosis not present

## 2021-10-02 NOTE — Patient Instructions (Signed)
Gabriela Mullins , ?Thank you for taking time to come for your Medicare Wellness Visit. I appreciate your ongoing commitment to your health goals. Please review the following plan we discussed and let me know if I can assist you in the future.  ? ?Screening recommendations/referrals: ?Colonoscopy: done 12/15/16. Repeat 12/2026 ?Mammogram: done 09/22/20. Scheduled for 10/05/21 ?Bone Density: done 09/06/20 ?Recommended yearly ophthalmology/optometry visit for glaucoma screening and checkup ?Recommended yearly dental visit for hygiene and checkup ? ?Vaccinations: ?Influenza vaccine: done 03/29/21 ?Pneumococcal vaccine: done 03/29/21 ?Tdap vaccine: done 08/04/18 ?Shingles vaccine: done 04/09/19 & 07/14/19 ?Covid-19:done 11/18/19 & 12/16/19 ? ?Advanced directives: Advance directive discussed with you today. Even though you declined this today please call our office should you change your mind and we can give you the proper paperwork for you to fill out.  ? ?Conditions/risks identified: Keep up the great work! ? ?Next appointment: Follow up in one year for your annual wellness visit  ? ? ?Preventive Care 8 Years and Older, Female ?Preventive care refers to lifestyle choices and visits with your health care provider that can promote health and wellness. ?What does preventive care include? ?A yearly physical exam. This is also called an annual well check. ?Dental exams once or twice a year. ?Routine eye exams. Ask your health care provider how often you should have your eyes checked. ?Personal lifestyle choices, including: ?Daily care of your teeth and gums. ?Regular physical activity. ?Eating a healthy diet. ?Avoiding tobacco and drug use. ?Limiting alcohol use. ?Practicing safe sex. ?Taking low-dose aspirin every day. ?Taking vitamin and mineral supplements as recommended by your health care provider. ?What happens during an annual well check? ?The services and screenings done by your health care provider during your annual well check  will depend on your age, overall health, lifestyle risk factors, and family history of disease. ?Counseling  ?Your health care provider may ask you questions about your: ?Alcohol use. ?Tobacco use. ?Drug use. ?Emotional well-being. ?Home and relationship well-being. ?Sexual activity. ?Eating habits. ?History of falls. ?Memory and ability to understand (cognition). ?Work and work Astronomer. ?Reproductive health. ?Screening  ?You may have the following tests or measurements: ?Height, weight, and BMI. ?Blood pressure. ?Lipid and cholesterol levels. These may be checked every 5 years, or more frequently if you are over 68 years old. ?Skin check. ?Lung cancer screening. You may have this screening every year starting at age 20 if you have a 30-pack-year history of smoking and currently smoke or have quit within the past 15 years. ?Fecal occult blood test (FOBT) of the stool. You may have this test every year starting at age 70. ?Flexible sigmoidoscopy or colonoscopy. You may have a sigmoidoscopy every 5 years or a colonoscopy every 10 years starting at age 58. ?Hepatitis C blood test. ?Hepatitis B blood test. ?Sexually transmitted disease (STD) testing. ?Diabetes screening. This is done by checking your blood sugar (glucose) after you have not eaten for a while (fasting). You may have this done every 1-3 years. ?Bone density scan. This is done to screen for osteoporosis. You may have this done starting at age 27. ?Mammogram. This may be done every 1-2 years. Talk to your health care provider about how often you should have regular mammograms. ?Talk with your health care provider about your test results, treatment options, and if necessary, the need for more tests. ?Vaccines  ?Your health care provider may recommend certain vaccines, such as: ?Influenza vaccine. This is recommended every year. ?Tetanus, diphtheria, and acellular pertussis (Tdap, Td) vaccine.  You may need a Td booster every 10 years. ?Zoster vaccine. You  may need this after age 51. ?Pneumococcal 13-valent conjugate (PCV13) vaccine. One dose is recommended after age 76. ?Pneumococcal polysaccharide (PPSV23) vaccine. One dose is recommended after age 55. ?Talk to your health care provider about which screenings and vaccines you need and how often you need them. ?This information is not intended to replace advice given to you by your health care provider. Make sure you discuss any questions you have with your health care provider. ?Document Released: 06/17/2015 Document Revised: 02/08/2016 Document Reviewed: 03/22/2015 ?Elsevier Interactive Patient Education ? 2017 [employer] ? ?Fall Prevention in the Home ?Falls can cause injuries. They can happen to people of all ages. There are many things you can do to make your home safe and to help prevent falls. ?What can I do on the outside of my home? ?Regularly fix the edges of walkways and driveways and fix any cracks. ?Remove anything that might make you trip as you walk through a door, such as a raised step or threshold. ?Trim any bushes or trees on the path to your home. ?Use bright outdoor lighting. ?Clear any walking paths of anything that might make someone trip, such as rocks or tools. ?Regularly check to see if handrails are loose or broken. Make sure that both sides of any steps have handrails. ?Any raised decks and porches should have guardrails on the edges. ?Have any leaves, snow, or ice cleared regularly. ?Use sand or salt on walking paths during winter. ?Clean up any spills in your garage right away. This includes oil or grease spills. ?What can I do in the bathroom? ?Use night lights. ?Install grab bars by the toilet and in the tub and shower. Do not use towel bars as grab bars. ?Use non-skid mats or decals in the tub or shower. ?If you need to sit down in the shower, use a plastic, non-slip stool. ?Keep the floor dry. Clean up any water that spills on the floor as soon as it happens. ?Remove soap buildup  in the tub or shower regularly. ?Attach bath mats securely with double-sided non-slip rug tape. ?Do not have throw rugs and other things on the floor that can make you trip. ?What can I do in the bedroom? ?Use night lights. ?Make sure that you have a light by your bed that is easy to reach. ?Do not use any sheets or blankets that are too big for your bed. They should not hang down onto the floor. ?Have a firm chair that has side arms. You can use this for support while you get dressed. ?Do not have throw rugs and other things on the floor that can make you trip. ?What can I do in the kitchen? ?Clean up any spills right away. ?Avoid walking on wet floors. ?Keep items that you use a lot in easy-to-reach places. ?If you need to reach something above you, use a strong step stool that has a grab bar. ?Keep electrical cords out of the way. ?Do not use floor polish or wax that makes floors slippery. If you must use wax, use non-skid floor wax. ?Do not have throw rugs and other things on the floor that can make you trip. ?What can I do with my stairs? ?Do not leave any items on the stairs. ?Make sure that there are handrails on both sides of the stairs and use them. Fix handrails that are broken or loose. Make sure that handrails are as long as  the stairways. ?Check any carpeting to make sure that it is firmly attached to the stairs. Fix any carpet that is loose or worn. ?Avoid having throw rugs at the top or bottom of the stairs. If you do have throw rugs, attach them to the floor with carpet tape. ?Make sure that you have a light switch at the top of the stairs and the bottom of the stairs. If you do not have them, ask someone to add them for you. ?What else can I do to help prevent falls? ?Wear shoes that: ?Do not have high heels. ?Have rubber bottoms. ?Are comfortable and fit you well. ?Are closed at the toe. Do not wear sandals. ?If you use a stepladder: ?Make sure that it is fully opened. Do not climb a closed  stepladder. ?Make sure that both sides of the stepladder are locked into place. ?Ask someone to hold it for you, if possible. ?Clearly mark and make sure that you can see: ?Any grab bars or handrails. ?First a

## 2021-10-02 NOTE — Progress Notes (Signed)
? ?Subjective:  ? Gabriela Mullins is a 66 y.o. female who presents for Medicare Annual (Subsequent) preventive examination. ? ?Virtual Visit via Telephone Note ? ?I connected with  [name] on 10/02/21 at  1:00 PM EDT by telephone and verified that I am speaking with the correct person using two identifiers. ? ?Location: ?Patient: home ?Provider: [facility] ?Persons participating in the virtual visit: patient/Nurse Health Advisor ?  ?I discussed the limitations, risks, security and privacy concerns of performing an evaluation and management service by telephone and the availability of in person appointments. The patient expressed understanding and agreed to proceed. ? ?Interactive audio and video telecommunications were attempted between this nurse and patient, however failed, due to patient having technical difficulties OR patient did not have access to video capability.  We continued and completed visit with audio only. ? ?Some vital signs may be absent or patient reported.  ? ?Clemetine Marker, LPN ? ? ?Review of Systems    ? ?Cardiac Risk Factors include: advanced age (>33men, >7 women);dyslipidemia;hypertension ? ?   ?Objective:  ?  ?Today's Vitals  ? 10/02/21 1309  ?PainSc: 2   ? ?There is no height or weight on file to calculate BMI. ? ? ?  10/02/2021  ?  1:14 PM 11/11/2020  ?  6:59 PM  ?Advanced Directives  ?Does Patient Have a Medical Advance Directive? No No  ?Would patient like information on creating a medical advance directive? No - Patient declined   ? ? ?Current Medications (verified) ?Outpatient Encounter Medications as of 10/02/2021  ?Medication Sig  ? atorvastatin (LIPITOR) 10 MG tablet Take 1 tablet (10 mg total) by mouth daily.  ? Ferrous Sulfate (IRON PO) Take 1 tablet by mouth daily.  ? hydrochlorothiazide (HYDRODIURIL) 50 MG tablet Take 1 tablet (50 mg total) by mouth daily.  ? meloxicam (MOBIC) 15 MG tablet Take 1 tablet (15 mg total) by mouth daily as needed for pain.  ? potassium citrate  (UROCIT-K) 10 MEQ (1080 MG) SR tablet Take 1 tablet (10 mEq total) by mouth 2 (two) times daily with a meal.  ? ?No facility-administered encounter medications on file as of 10/02/2021.  ? ? ?Allergies (verified) ?Clindamycin, Levofloxacin, Lidocaine, Penicillins, and Tetracycline  ? ?History: ?Past Medical History:  ?Diagnosis Date  ? Hyperlipidemia   ? Nephrolithiasis   ? Osteopenia   ? ?Past Surgical History:  ?Procedure Laterality Date  ? CHOLECYSTECTOMY  1990  ? EXTRACORPOREAL SHOCK WAVE LITHOTRIPSY    ? TONSILLECTOMY AND ADENOIDECTOMY    ? URETEROLITHOTOMY  1970  ? WISDOM TOOTH EXTRACTION Bilateral   ? ?Family History  ?Problem Relation Age of Onset  ? Arthritis Mother   ? Cancer Mother   ? Dementia Mother   ? Heart attack Father   ? Heart murmur Father   ? Hernia Brother   ? Factor V Leiden deficiency Daughter   ? Clotting disorder Daughter   ? Heart attack Paternal Grandfather   ? Breast cancer Neg Hx   ? ?Social History  ? ?Socioeconomic History  ? Marital status: Married  ?  Spouse name: Zariaha Goenner  ? Number of children: 2  ? Years of education: 56  ? Highest education level: Bachelor's degree (e.g., BA, AB, BS)  ?Occupational History  ? Occupation: Retired  ?Tobacco Use  ? Smoking status: Never  ? Smokeless tobacco: Never  ?Vaping Use  ? Vaping Use: Never used  ?Substance and Sexual Activity  ? Alcohol use: Not Currently  ? Drug  use: Never  ? Sexual activity: Yes  ?  Partners: Male  ?Other Topics Concern  ? Not on file  ?Social History Narrative  ? Not on file  ? ?Social Determinants of Health  ? ?Financial Resource Strain: Low Risk   ? Difficulty of Paying Living Expenses: Not hard at all  ?Food Insecurity: No Food Insecurity  ? Worried About Programme researcher, broadcasting/film/video in the Last Year: Never true  ? Ran Out of Food in the Last Year: Never true  ?Transportation Needs: No Transportation Needs  ? Lack of Transportation (Medical): No  ? Lack of Transportation (Non-Medical): No  ?Physical Activity: Not on file   ?Stress: No Stress Concern Present  ? Feeling of Stress : Not at all  ?Social Connections: Socially Integrated  ? Frequency of Communication with Friends and Family: More than three times a week  ? Frequency of Social Gatherings with Friends and Family: Twice a week  ? Attends Religious Services: More than 4 times per year  ? Active Member of Clubs or Organizations: Yes  ? Attends Banker Meetings: More than 4 times per year  ? Marital Status: Married  ? ? ?Tobacco Counseling ?Counseling given: Not Answered ? ? ?Clinical Intake: ? ?Pre-visit preparation completed: Yes ? ?Pain : 0-10 ?Pain Score: 2  ?Pain Type: Chronic pain ?Pain Location: Hip ?Pain Descriptors / Indicators: Aching, Sore ?Pain Onset: More than a month ago ?Pain Frequency: Constant ? ?  ? ?Nutritional Risks: None ?Diabetes: No ? ?How often do you need to have someone help you when you read instructions, pamphlets, or other written materials from your doctor or pharmacy?: 1 - Never ? ? ?Interpreter Needed?: No ? ?Information entered by :: Reather Littler LPN ? ? ?Activities of Daily Living ? ?  10/02/2021  ?  1:16 PM 09/29/2021  ?  9:38 AM  ?In your present state of health, do you have any difficulty performing the following activities:  ?Hearing? 0 0  ?Vision? 0 0  ?Difficulty concentrating or making decisions? 0 0  ?Walking or climbing stairs? 0 0  ?Dressing or bathing? 0 0  ?Doing errands, shopping? 0 0  ?Preparing Food and eating ? N N  ?Using the Toilet? N N  ?In the past six months, have you accidently leaked urine? N N  ?Do you have problems with loss of bowel control? N N  ?Managing your Medications? N N  ?Managing your Finances? N N  ?Housekeeping or managing your Housekeeping? N N  ? ? ?Patient Care Team: ?Reubin Milan, MD as PCP - General (Internal Medicine) ?Stoioff, Verna Czech, MD (Urology) ? ?Indicate any recent Medical Services you may have received from other than [facility] providers in the past year (date may be approximate). ? ?    ?Assessment:  ? This is a routine wellness examination for [name]. ? ?Hearing/Vision screen ?Hearing Screening - Comments:: Pt denies hearing difficulty ?Vision Screening - Comments:: Annual vision screenings done with Dr. Clearance Coots ? ?Dietary issues and exercise activities discussed: ?Current Exercise Habits: Home exercise routine, Type of exercise: strength training/weights;Other - see comments (elliptical), Time (Minutes): 40, Frequency (Times/Week): 2, Weekly Exercise (Minutes/Week): 80, Intensity: Moderate, Exercise limited by: orthopedic condition(s) ? ? Goals Addressed   ? ?  ?  ?  ?  ? This Visit's Progress  ?  Weight (lb) < 165 lb (74.8 kg)     ?  Pt states she would like lose weight over the next year with healthy eating  and physical activity  ?  ? ?  ?Depression Screen ? ?  10/02/2021  ?  1:12 PM 09/06/2021  ?  8:13 AM 06/29/2021  ?  9:53 AM 06/27/2021  ?  9:49 AM 03/29/2021  ?  2:58 PM  ?PHQ 2/9 Scores  ?PHQ - 2 Score 0 1 0 1 0  ?PHQ- 9 Score 2 3 1 3  0  ?  ?Fall Risk ? ?  10/02/2021  ?  1:14 PM 09/29/2021  ?  9:38 AM 09/06/2021  ?  8:13 AM 06/29/2021  ?  9:14 AM 06/27/2021  ?  9:48 AM  ?Fall Risk   ?Falls in the past year? 0 0 0 0 0  ?Number falls in past yr: 0  0 0 0  ?Injury with Fall? 0 0 0 0 0  ?Risk for fall due to : No Fall Risks  No Fall Risks Orthopedic patient Orthopedic patient  ?Follow up Falls prevention discussed  Falls evaluation completed Falls evaluation completed Falls evaluation completed  ? ? ?FALL RISK PREVENTION PERTAINING TO THE HOME: ? ?Any stairs in or around the home? Yes  ?If so, are there any without handrails? Yes  - 2 steps outside ?Home free of loose throw rugs in walkways, pet beds, electrical cords, etc? Yes  ?Adequate lighting in your home to reduce risk of falls? Yes  ? ?ASSISTIVE DEVICES UTILIZED TO PREVENT FALLS: ? ?Life alert? No  ?Use of a cane, walker or w/c? No  ?Grab bars in the bathroom? Yes  ?Shower chair or bench in shower? Yes  ?Elevated toilet seat or a handicapped toilet?  Yes  ? ?TIMED UP AND GO: ? ?Was the test performed? No . Telephonic visit.  ? ?Cognitive Function: Normal cognitive status assessed by direct observation by this Nurse Health Advisor. No abnormalities found.  ? ?

## 2021-10-05 ENCOUNTER — Ambulatory Visit
Admission: RE | Admit: 2021-10-05 | Discharge: 2021-10-05 | Disposition: A | Discharge status: Home / Self Care | Payer: Medicare PPO | Source: Ambulatory Visit | Attending: Internal Medicine | Admitting: Internal Medicine

## 2021-10-05 DIAGNOSIS — Z1231 Encounter for screening mammogram for malignant neoplasm of breast: Secondary | ICD-10-CM | POA: Insufficient documentation

## 2021-10-31 ENCOUNTER — Encounter: Payer: Self-pay | Admitting: Internal Medicine

## 2021-10-31 ENCOUNTER — Ambulatory Visit: Payer: Medicare PPO | Admitting: Internal Medicine

## 2021-10-31 VITALS — BP 118/78 | HR 76 | Temp 98.0°F | Ht 68.0 in | Wt 180.8 lb

## 2021-10-31 DIAGNOSIS — J0100 Acute maxillary sinusitis, unspecified: Secondary | ICD-10-CM

## 2021-10-31 MED ORDER — AZITHROMYCIN 250 MG PO TABS: ORAL_TABLET | ORAL | 0 refills | Status: AC

## 2021-10-31 NOTE — Patient Instructions (Signed)
Claritin or Allegra - good for post nasal drainage  Coricidin HBP - good congestion

## 2021-10-31 NOTE — Progress Notes (Signed)
Date:  10/31/2021   Name:  Gabriela Mullins   DOB:  02/18/1956   MRN:  [id number]   Chief Complaint: Nasal Congestion  Cough This is a new problem. Episode onset: Started 10/20/2021. The problem has been waxing and waning. The cough is Non-productive. Associated symptoms include ear pain (Pressure), nasal congestion, postnasal drip and rhinorrhea. Pertinent negatives include no chest pain, chills, fever, headaches, myalgias, sore throat, shortness of breath or wheezing. Treatments tried: sudafed. The treatment provided no relief.   Lab Results  Component Value Date   NA 141 09/06/2021   K 3.6 09/06/2021   CO2 26 09/06/2021   GLUCOSE 101 (H) 09/06/2021   BUN 17 09/06/2021   CREATININE 0.78 09/06/2021   CALCIUM 10.3 09/06/2021   EGFR 83 09/06/2021   Lab Results  Component Value Date   CHOL 186 09/06/2021   HDL 68 09/06/2021   LDLCALC 99 09/06/2021   TRIG 109 09/06/2021   CHOLHDL 2.7 09/06/2021   Lab Results  Component Value Date   TSH 1.390 09/06/2021   Lab Results  Component Value Date   HGBA1C 5.8 (H) 09/06/2021   Lab Results  Component Value Date   WBC 6.6 09/06/2021   HGB 15.6 09/06/2021   HCT 45.6 09/06/2021   MCV 87 09/06/2021   PLT 349 09/06/2021   Lab Results  Component Value Date   ALT 17 09/06/2021   AST 18 09/06/2021   ALKPHOS 75 09/06/2021   BILITOT 0.9 09/06/2021   Lab Results  Component Value Date   VD25OH 35.5 09/06/2021     Review of Systems  Constitutional:  Negative for chills, fatigue, fever and unexpected weight change.  HENT:  Positive for congestion, ear pain (Pressure), postnasal drip, rhinorrhea and sinus pressure. Negative for sore throat.   Respiratory:  Positive for cough. Negative for chest tightness, shortness of breath and wheezing.   Cardiovascular:  Negative for chest pain.  Musculoskeletal:  Negative for myalgias.  Neurological:  Negative for dizziness, light-headedness and headaches.   Patient Active Problem List    Diagnosis Date Noted   Greater trochanteric pain syndrome of left lower extremity 06/27/2021   Primary osteoarthritis of left hip 06/27/2021   Prediabetes 03/29/2021   Osteopenia determined by x-ray 03/29/2021   Hypercholesterolemia 09/30/2013   Nephrolithiasis 09/30/2013   Double voiding 04/15/2013   Incomplete emptying of bladder 04/09/2012    Allergies  Allergen Reactions   Clindamycin Hives   Levofloxacin Hives   Lidocaine Hives   Penicillins Hives   Tetracycline Hives    Past Surgical History:  Procedure Laterality Date   CHOLECYSTECTOMY  1990   EXTRACORPOREAL SHOCK WAVE LITHOTRIPSY     TONSILLECTOMY AND ADENOIDECTOMY     URETEROLITHOTOMY  1970   WISDOM TOOTH EXTRACTION Bilateral     Social History   Tobacco Use   Smoking status: Never   Smokeless tobacco: Never  Vaping Use   Vaping Use: Never used  Substance Use Topics   Alcohol use: Not Currently   Drug use: Never     Medication list has been reviewed and updated.  Current Meds  Medication Sig   atorvastatin (LIPITOR) 10 MG tablet Take 1 tablet (10 mg total) by mouth daily.   azithromycin (ZITHROMAX Z-PAK) 250 MG tablet UAD   Ferrous Sulfate (IRON PO) Take 1 tablet by mouth daily.   hydrochlorothiazide (HYDRODIURIL) 50 MG tablet Take 1 tablet (50 mg total) by mouth daily.   meloxicam (MOBIC) 15 MG tablet Take 1  tablet (15 mg total) by mouth daily as needed for pain.   potassium citrate (UROCIT-K) 10 MEQ (1080 MG) SR tablet Take 1 tablet (10 mEq total) by mouth 2 (two) times daily with a meal.       10/31/2021   10:40 AM 09/06/2021    8:13 AM 06/29/2021    9:53 AM 06/27/2021    9:50 AM  GAD 7 : Generalized Anxiety Score  Nervous, Anxious, on Edge 0 0 0 0  Control/stop worrying 0 0 0 0  Worry too much - different things 0 0 0 0  Trouble relaxing 0 0 0 0  Restless 0 0 0 0  Easily annoyed or irritable 0 0 0 0  Afraid - awful might happen 0 0 0 0  Total GAD 7 Score 0 0 0 0  Anxiety Difficulty Not  difficult at all Not difficult at all Not difficult at all Not difficult at all       10/31/2021   10:40 AM  Depression screen PHQ 2/9  Decreased Interest 1  Down, Depressed, Hopeless 0  PHQ - 2 Score 1  Altered sleeping 2  Tired, decreased energy 2  Change in appetite 1  Feeling bad or failure about yourself  0  Trouble concentrating 0  Moving slowly or fidgety/restless 0  Suicidal thoughts 0  PHQ-9 Score 6  Difficult doing work/chores Not difficult at all    BP Readings from Last 3 Encounters:  10/31/21 118/78  09/06/21 124/70  06/29/21 108/82    Physical Exam Constitutional:      Appearance: Normal appearance. She is well-developed.  HENT:     Right Ear: Ear canal and external ear normal. Tympanic membrane is not erythematous or retracted.     Left Ear: Ear canal and external ear normal. Tympanic membrane is not erythematous or retracted.     Nose:     Right Sinus: Maxillary sinus tenderness present. No frontal sinus tenderness.     Left Sinus: Maxillary sinus tenderness present. No frontal sinus tenderness.     Mouth/Throat:     Mouth: No oral lesions.     Pharynx: Uvula midline. Posterior oropharyngeal erythema present. No oropharyngeal exudate.  Cardiovascular:     Rate and Rhythm: Normal rate and regular rhythm.     Heart sounds: Normal heart sounds.  Pulmonary:     Effort: Pulmonary effort is normal.     Breath sounds: Normal breath sounds. No wheezing or rales.  Musculoskeletal:     Cervical back: Normal range of motion.  Lymphadenopathy:     Cervical: No cervical adenopathy.  Neurological:     Mental Status: She is alert and oriented to person, place, and time.    Wt Readings from Last 3 Encounters:  10/31/21 180 lb 12.8 oz (82 kg)  09/06/21 178 lb 3.2 oz (80.8 kg)  06/29/21 179 lb (81.2 kg)    BP 118/78   Pulse 76   Temp 98 F (36.7 C) (Oral)   Ht _0  (1.727 m)   Wt 180 lb 12.8 oz (82 kg)   SpO2 94%   BMI 27.49 kg/m   Assessment and  Plan: 1. Acute non-recurrent maxillary sinusitis Recommend Claritin or Allegra and Coricidin HBP Push fluids - azithromycin (ZITHROMAX Z-PAK) 250 MG tablet; UAD  Dispense: 6 each; Refill: 0   Partially dictated using Editor, commissioning. Any errors are unintentional.  Halina Maidens, MD [facility]  10/31/2021

## 2021-11-01 ENCOUNTER — Ambulatory Visit: Payer: Medicare PPO | Admitting: Internal Medicine

## 2021-12-01 ENCOUNTER — Encounter: Payer: Self-pay | Admitting: Family Medicine

## 2021-12-01 ENCOUNTER — Other Ambulatory Visit: Payer: Self-pay | Admitting: Family Medicine

## 2021-12-01 DIAGNOSIS — M25552 Pain in left hip: Secondary | ICD-10-CM

## 2021-12-01 DIAGNOSIS — M1612 Unilateral primary osteoarthritis, left hip: Secondary | ICD-10-CM

## 2021-12-01 MED ORDER — MELOXICAM 15 MG PO TABS: 15.0000 mg | ORAL_TABLET | Freq: Every day | ORAL | 0 refills | Status: DC | PRN

## 2021-12-29 ENCOUNTER — Encounter: Payer: Self-pay | Admitting: Internal Medicine

## 2022-01-05 ENCOUNTER — Ambulatory Visit
Admission: RE | Admit: 2022-01-05 | Discharge: 2022-01-05 | Disposition: A | Discharge status: Home / Self Care | Payer: Medicare PPO | Source: Ambulatory Visit | Attending: Urology | Admitting: Urology

## 2022-01-05 DIAGNOSIS — N200 Calculus of kidney: Secondary | ICD-10-CM | POA: Diagnosis not present

## 2022-01-10 ENCOUNTER — Ambulatory Visit: Payer: Medicare PPO | Admitting: Urology

## 2022-01-10 ENCOUNTER — Encounter: Payer: Self-pay | Admitting: Urology

## 2022-01-10 VITALS — BP 131/81 | HR 81 | Ht 68.0 in | Wt 182.0 lb

## 2022-01-10 DIAGNOSIS — N200 Calculus of kidney: Secondary | ICD-10-CM | POA: Diagnosis not present

## 2022-01-10 NOTE — Progress Notes (Signed)
01/10/2022 9:11 AM   [name] Feb 16, 1956 [id number]  Referring provider: Myrene Buddy, NP [address] [place],  [place] [address]  Chief Complaint  Patient presents with   Nephrolithiasis    Urologic history: 1.  Bilateral nephrolithiasis -Small, bilateral renal calculi -Prior ESWL x2 -Open ureterolithotomy late 1970s -On potassium citrate   HPI: 66 y.o. female presents for annual follow-up.  Doing well since last years visit Denies flank, abdominal, pelvic pain/renal colic No bothersome LUTS KUB performed 01/05/2022 was reviewed and there are stable, bilateral renal calculi unchanged from prior KUBs   PMH: Hyperlipidemia  Surgical History: Cholecystectomy ESWL Ureterolithotomy  Home Medications:  Allergies as of 01/10/2022       Reactions   Clindamycin Hives   Levofloxacin Hives   Lidocaine Hives   Penicillins Hives   Tetracycline Hives        Medication List        Accurate as of January 10, 2022  9:11 AM. If you have any questions, ask your nurse or doctor.          atorvastatin 10 MG tablet Commonly known as: LIPITOR Take 1 tablet (10 mg total) by mouth daily.   hydrochlorothiazide 50 MG tablet Commonly known as: HYDRODIURIL Take 1 tablet (50 mg total) by mouth daily.   IRON PO Take 1 tablet by mouth daily.   meloxicam 15 MG tablet Commonly known as: MOBIC Take 1 tablet (15 mg total) by mouth daily as needed for pain.   potassium citrate 10 MEQ (1080 MG) SR tablet Commonly known as: UROCIT-K Take 1 tablet (10 mEq total) by mouth 2 (two) times daily with a meal.        Allergies:  Allergies  Allergen Reactions   Clindamycin Hives   Levofloxacin Hives   Lidocaine Hives   Penicillins Hives   Tetracycline Hives    Family History: Family History  Problem Relation Age of Onset   Arthritis Mother    Cancer Mother    Dementia Mother    Heart attack Father    Heart murmur Father    Hernia Brother    Factor  V Leiden deficiency Daughter    Clotting disorder Daughter    Heart attack Paternal Grandfather    Breast cancer Neg Hx     Social History:  reports that she has never smoked. She has never used smokeless tobacco. She reports that she does not currently use alcohol. She reports that she does not use drugs.   Physical Exam: BP 131/81   Pulse 81   Ht 5\' 8"  (1.727 m)   Wt 182 lb (82.6 kg)   BMI 27.67 kg/m   Constitutional:  Alert and oriented, No acute distress. HEENT: [place] AT Respiratory: Normal respiratory effort, no increased work of breathing.   Pertinent Imaging: KUB images were personally reviewed and interpreted   Abdomen 1 view (KUB)  Narrative CLINICAL DATA:  History of bilateral kidney stones.  EXAM: ABDOMEN - 1 VIEW  COMPARISON:  Abdominal radiograph dated 01/06/2020.  FINDINGS: The bowel gas pattern is normal. There are 5 mm calculi overlying both kidneys appear unchanged since 01/06/2020. Mild degenerative changes are seen in the spine and hips.  IMPRESSION: Bilateral renal calculi measuring up to 5 mm are not significantly changed since 2021.   Electronically Signed By: 2022 M.D. On: 01/08/2021 13:27   Assessment & Plan:    1.Bilateral nephrolithiasis Stable, bilateral renal calculi Follow-up 2 years with KUB Call earlier  for recurrent flank pain/stone symptoms   Riki Altes, MD  [facility] [address], [address] [place], [place] [address] [phone]

## 2022-02-22 ENCOUNTER — Other Ambulatory Visit: Payer: Self-pay | Admitting: Internal Medicine

## 2022-02-22 DIAGNOSIS — M25552 Pain in left hip: Secondary | ICD-10-CM

## 2022-02-22 DIAGNOSIS — M1612 Unilateral primary osteoarthritis, left hip: Secondary | ICD-10-CM

## 2022-02-22 NOTE — Telephone Encounter (Signed)
Requested Prescriptions  Pending Prescriptions Disp Refills  . meloxicam (MOBIC) 15 MG tablet [Pharmacy Med Name: Meloxicam 15 MG Oral Tablet] 90 tablet 2    Sig: TAKE 1 TABLET BY MOUTH ONCE DAILY AS NEEDED FOR PAIN     Analgesics:  COX2 Inhibitors Failed - 02/22/2022  9:13 AM      Failed - Manual Review: Labs are only required if the patient has taken medication for more than 8 weeks.      Passed - HGB in normal range and within 360 days    Hemoglobin  Date Value Ref Range Status  09/06/2021 15.6 11.1 - 15.9 g/dL Final         Passed - Cr in normal range and within 360 days    Creatinine, Ser  Date Value Ref Range Status  09/06/2021 0.78 0.57 - 1.00 mg/dL Final         Passed - HCT in normal range and within 360 days    Hematocrit  Date Value Ref Range Status  09/06/2021 45.6 34.0 - 46.6 % Final         Passed - AST in normal range and within 360 days    AST  Date Value Ref Range Status  09/06/2021 18 0 - 40 IU/L Final         Passed - ALT in normal range and within 360 days    ALT  Date Value Ref Range Status  09/06/2021 17 0 - 32 IU/L Final         Passed - eGFR is 30 or above and within 360 days    eGFR  Date Value Ref Range Status  09/06/2021 83 >59 mL/min/1.73 Final         Passed - Patient is not pregnant      Passed - Valid encounter within last 12 months    Recent Outpatient Visits          3 months ago Acute non-recurrent maxillary sinusitis   [facility], Jesse Sans, MD   5 months ago Annual physical exam   [facility], Jesse Sans, MD   7 months ago Greater trochanteric pain syndrome of left lower extremity   [facility] [place], Earley Abide, MD   8 months ago Greater trochanteric pain syndrome of left lower extremity   [facility] [place], Earley Abide, MD   11 months ago Primary osteoarthritis of both hips   [facility], Jesse Sans, MD      Future Appointments            In 6 months [facility], Jesse Sans, MD [place] [facility] at [facility], [facility]

## 2022-03-07 ENCOUNTER — Encounter: Payer: Self-pay | Admitting: Internal Medicine

## 2022-03-12 ENCOUNTER — Encounter: Payer: Self-pay | Admitting: Internal Medicine

## 2022-03-13 ENCOUNTER — Ambulatory Visit (INDEPENDENT_AMBULATORY_CARE_PROVIDER_SITE_OTHER): Payer: Medicare PPO

## 2022-03-13 DIAGNOSIS — Z23 Encounter for immunization: Secondary | ICD-10-CM

## 2022-03-14 ENCOUNTER — Encounter: Payer: Self-pay | Admitting: Internal Medicine

## 2022-03-15 ENCOUNTER — Encounter: Payer: Self-pay | Admitting: Internal Medicine

## 2022-03-15 ENCOUNTER — Telehealth (INDEPENDENT_AMBULATORY_CARE_PROVIDER_SITE_OTHER): Payer: Medicare PPO | Admitting: Internal Medicine

## 2022-03-15 VITALS — Ht 68.0 in

## 2022-03-15 DIAGNOSIS — U071 COVID-19: Secondary | ICD-10-CM | POA: Diagnosis not present

## 2022-03-15 MED ORDER — MOLNUPIRAVIR EUA 200MG CAPSULE: 4.0000 | ORAL_CAPSULE | Freq: Two times a day (BID) | ORAL | 0 refills | Status: AC

## 2022-03-15 NOTE — Progress Notes (Signed)
Date:  03/15/2022   Name:  Gabriela Mullins   DOB:  September 05, 1955   MRN:  [id number]  This encounter was conducted via video encounter due to the need for social distancing in light of the Covid-19 pandemic.  The patient was correctly identified.  I advised that I am conducting the visit from a secure room in my office at [facility].  The patient is located at home. The limitations of this form of encounter were discussed with the patient and he/she agreed to proceed.  Some vital signs will be absent.  Chief Complaint: Covid Positive (Tested yesterday )  URI  This is a new problem. The current episode started yesterday (tested positive for Covid this AM). The problem has been gradually improving. Fever duration: 99.4 03/14/2022. Associated symptoms include congestion, coughing (dry), sinus pain and a sore throat. Pertinent negatives include no chest pain, diarrhea, nausea, vomiting or wheezing. Treatments tried: sudafed. The treatment provided mild relief.    Lab Results  Component Value Date   NA 141 09/06/2021   K 3.6 09/06/2021   CO2 26 09/06/2021   GLUCOSE 101 (H) 09/06/2021   BUN 17 09/06/2021   CREATININE 0.78 09/06/2021   CALCIUM 10.3 09/06/2021   EGFR 83 09/06/2021   Lab Results  Component Value Date   CHOL 186 09/06/2021   HDL 68 09/06/2021   LDLCALC 99 09/06/2021   TRIG 109 09/06/2021   CHOLHDL 2.7 09/06/2021   Lab Results  Component Value Date   TSH 1.390 09/06/2021   Lab Results  Component Value Date   HGBA1C 5.8 (H) 09/06/2021   Lab Results  Component Value Date   WBC 6.6 09/06/2021   HGB 15.6 09/06/2021   HCT 45.6 09/06/2021   MCV 87 09/06/2021   PLT 349 09/06/2021   Lab Results  Component Value Date   ALT 17 09/06/2021   AST 18 09/06/2021   ALKPHOS 75 09/06/2021   BILITOT 0.9 09/06/2021   Lab Results  Component Value Date   VD25OH 35.5 09/06/2021     Review of Systems  Constitutional:  Positive for fatigue and fever. Negative for  chills.  HENT:  Positive for congestion, sinus pain and sore throat.   Respiratory:  Positive for cough (dry). Negative for chest tightness, shortness of breath and wheezing.   Cardiovascular:  Negative for chest pain.  Gastrointestinal:  Negative for diarrhea, nausea and vomiting.  Psychiatric/Behavioral:  Negative for dysphoric mood and sleep disturbance. The patient is not nervous/anxious.     Patient Active Problem List   Diagnosis Date Noted   Greater trochanteric pain syndrome of left lower extremity 06/27/2021   Primary osteoarthritis of left hip 06/27/2021   Prediabetes 03/29/2021   Osteopenia determined by x-ray 03/29/2021   Hypercholesterolemia 09/30/2013   Nephrolithiasis 09/30/2013   Double voiding 04/15/2013   Incomplete emptying of bladder 04/09/2012    Allergies  Allergen Reactions   Clindamycin Hives   Levofloxacin Hives   Lidocaine Hives   Penicillins Hives   Tetracycline Hives    Past Surgical History:  Procedure Laterality Date   CHOLECYSTECTOMY  1990   EXTRACORPOREAL SHOCK WAVE LITHOTRIPSY     TONSILLECTOMY AND ADENOIDECTOMY     URETEROLITHOTOMY  1970   WISDOM TOOTH EXTRACTION Bilateral     Social History   Tobacco Use   Smoking status: Never   Smokeless tobacco: Never  Vaping Use   Vaping Use: Never used  Substance Use Topics   Alcohol use: Not  Currently   Drug use: Never     Medication list has been reviewed and updated.  Current Meds  Medication Sig   atorvastatin (LIPITOR) 10 MG tablet Take 1 tablet (10 mg total) by mouth daily.   Ferrous Sulfate (IRON PO) Take 1 tablet by mouth daily.   hydrochlorothiazide (HYDRODIURIL) 50 MG tablet Take 1 tablet (50 mg total) by mouth daily.   meloxicam (MOBIC) 15 MG tablet TAKE 1 TABLET BY MOUTH ONCE DAILY AS NEEDED FOR PAIN (Patient taking differently: every other day.)   molnupiravir EUA (LAGEVRIO) 200 mg CAPS capsule Take 4 capsules (800 mg total) by mouth 2 (two) times daily for 5 days.    potassium citrate (UROCIT-K) 10 MEQ (1080 MG) SR tablet Take 1 tablet (10 mEq total) by mouth 2 (two) times daily with a meal.   Pseudoephedrine HCl (SUDAFED PO) Take by mouth every 4 (four) hours as needed.       03/15/2022   10:55 AM 10/31/2021   10:40 AM 09/06/2021    8:13 AM 06/29/2021    9:53 AM  GAD 7 : Generalized Anxiety Score  Nervous, Anxious, on Edge 0 0 0 0  Control/stop worrying 0 0 0 0  Worry too much - different things 0 0 0 0  Trouble relaxing 0 0 0 0  Restless 0 0 0 0  Easily annoyed or irritable 0 0 0 0  Afraid - awful might happen 0 0 0 0  Total GAD 7 Score 0 0 0 0  Anxiety Difficulty Not difficult at all Not difficult at all Not difficult at all Not difficult at all       03/15/2022   10:55 AM 10/31/2021   10:40 AM 10/02/2021    1:12 PM  Depression screen PHQ 2/9  Decreased Interest 0 1 0  Down, Depressed, Hopeless 0 0 0  PHQ - 2 Score 0 1 0  Altered sleeping 0 2 1  Tired, decreased energy 0 2 1  Change in appetite 0 1 0  Feeling bad or failure about yourself  0 0 0  Trouble concentrating 0 0 0  Moving slowly or fidgety/restless 0 0 0  Suicidal thoughts 0 0 0  PHQ-9 Score 0 6 2  Difficult doing work/chores Not difficult at all Not difficult at all Not difficult at all    BP Readings from Last 3 Encounters:  01/10/22 131/81  10/31/21 118/78  09/06/21 124/70    Physical Exam Constitutional:      Appearance: Normal appearance.  Pulmonary:     Effort: Pulmonary effort is normal.     Comments: No cough or dyspnea noted during the call Neurological:     General: No focal deficit present.     Mental Status: She is alert.  Psychiatric:        Attention and Perception: Attention normal.        Mood and Affect: Mood normal.        Speech: Speech normal.        Behavior: Behavior normal.        Cognition and Memory: Cognition normal.     Wt Readings from Last 3 Encounters:  01/10/22 182 lb (82.6 kg)  10/31/21 180 lb 12.8 oz (82 kg)  09/06/21 178  lb 3.2 oz (80.8 kg)    Ht 5' 8"  (1.727 m)   BMI 27.67 kg/m   Assessment and Plan: 1. COVID-19 virus infection Take Tylenol 650 - 1000 mg every 6-8 hours for fever, body  aches and headache. Drink plenty of fluids with electrolytes. Monitor for fever that does not decrease and/or shortness of breath that worsens or is present at rest. Quarantine for 5 days. Call for cough syrup if needed.  Continue Sudafed for nasal congestion. Call if s/s of bacterial infection - molnupiravir EUA (LAGEVRIO) 200 mg CAPS capsule; Take 4 capsules (800 mg total) by mouth 2 (two) times daily for 5 days.  Dispense: 40 capsule; Refill: 0  I spent 8 minutes on the encounter, 100% by video. Partially dictated using Editor, commissioning. Any errors are unintentional.  Halina Maidens, MD [facility]  03/15/2022 \

## 2022-03-15 NOTE — Patient Instructions (Signed)
Take Tylenol 650 - 1000 mg every 6-8 hours for fever, body aches and headache. Drink plenty of fluids with electrolytes. Monitor for fever that does not decrease and/or shortness of breath that worsens or is present at rest. Quarantine for 5 days.  

## 2022-03-31 ENCOUNTER — Encounter: Payer: Self-pay | Admitting: Internal Medicine

## 2022-04-01 ENCOUNTER — Other Ambulatory Visit: Payer: Self-pay | Admitting: Internal Medicine

## 2022-04-01 DIAGNOSIS — E7800 Pure hypercholesterolemia, unspecified: Secondary | ICD-10-CM

## 2022-04-01 MED ORDER — HYDROCHLOROTHIAZIDE 50 MG PO TABS: 50.0000 mg | ORAL_TABLET | Freq: Every day | ORAL | 1 refills | Status: DC

## 2022-04-01 MED ORDER — ATORVASTATIN CALCIUM 10 MG PO TABS: 10.0000 mg | ORAL_TABLET | Freq: Every day | ORAL | 1 refills | Status: DC

## 2022-05-10 ENCOUNTER — Encounter: Payer: Self-pay | Admitting: Internal Medicine

## 2022-05-14 DIAGNOSIS — H43812 Vitreous degeneration, left eye: Secondary | ICD-10-CM | POA: Diagnosis not present

## 2022-06-28 ENCOUNTER — Other Ambulatory Visit: Payer: Self-pay | Admitting: Internal Medicine

## 2022-06-28 ENCOUNTER — Encounter: Payer: Self-pay | Admitting: Internal Medicine

## 2022-06-28 MED ORDER — POTASSIUM CITRATE ER 10 MEQ (1080 MG) PO TBCR: 10.0000 meq | EXTENDED_RELEASE_TABLET | Freq: Two times a day (BID) | ORAL | 1 refills | Status: DC

## 2022-09-10 ENCOUNTER — Ambulatory Visit
Admission: RE | Admit: 2022-09-10 | Discharge: 2022-09-10 | Disposition: A | Discharge status: Home / Self Care | Payer: Medicare PPO | Attending: Internal Medicine | Admitting: Internal Medicine

## 2022-09-10 ENCOUNTER — Ambulatory Visit (INDEPENDENT_AMBULATORY_CARE_PROVIDER_SITE_OTHER): Payer: Medicare PPO | Admitting: Internal Medicine

## 2022-09-10 ENCOUNTER — Encounter: Payer: Self-pay | Admitting: Internal Medicine

## 2022-09-10 ENCOUNTER — Ambulatory Visit
Admission: RE | Admit: 2022-09-10 | Discharge: 2022-09-10 | Disposition: A | Discharge status: Home / Self Care | Payer: Medicare PPO | Source: Ambulatory Visit | Attending: Internal Medicine | Admitting: Internal Medicine

## 2022-09-10 ENCOUNTER — Other Ambulatory Visit: Payer: Medicare PPO

## 2022-09-10 VITALS — BP 120/70 | HR 88 | Ht 68.0 in | Wt 184.6 lb

## 2022-09-10 DIAGNOSIS — Z0000 Encounter for general adult medical examination without abnormal findings: Secondary | ICD-10-CM

## 2022-09-10 DIAGNOSIS — Z1231 Encounter for screening mammogram for malignant neoplasm of breast: Secondary | ICD-10-CM | POA: Diagnosis not present

## 2022-09-10 DIAGNOSIS — M8580 Other specified disorders of bone density and structure, unspecified site: Secondary | ICD-10-CM | POA: Diagnosis not present

## 2022-09-10 DIAGNOSIS — M1612 Unilateral primary osteoarthritis, left hip: Secondary | ICD-10-CM | POA: Diagnosis not present

## 2022-09-10 DIAGNOSIS — M25552 Pain in left hip: Secondary | ICD-10-CM | POA: Diagnosis not present

## 2022-09-10 DIAGNOSIS — N200 Calculus of kidney: Secondary | ICD-10-CM

## 2022-09-10 DIAGNOSIS — D485 Neoplasm of uncertain behavior of skin: Secondary | ICD-10-CM

## 2022-09-10 DIAGNOSIS — Z791 Long term (current) use of non-steroidal anti-inflammatories (NSAID): Secondary | ICD-10-CM

## 2022-09-10 DIAGNOSIS — R7303 Prediabetes: Secondary | ICD-10-CM

## 2022-09-10 DIAGNOSIS — E7800 Pure hypercholesterolemia, unspecified: Secondary | ICD-10-CM

## 2022-09-10 MED ORDER — POTASSIUM CITRATE ER 10 MEQ (1080 MG) PO TBCR: 10.0000 meq | EXTENDED_RELEASE_TABLET | Freq: Two times a day (BID) | ORAL | 3 refills | Status: DC

## 2022-09-10 MED ORDER — ATORVASTATIN CALCIUM 10 MG PO TABS: 10.0000 mg | ORAL_TABLET | Freq: Every day | ORAL | 3 refills | Status: DC

## 2022-09-10 MED ORDER — HYDROCHLOROTHIAZIDE 50 MG PO TABS: 50.0000 mg | ORAL_TABLET | Freq: Every day | ORAL | 3 refills | Status: DC

## 2022-09-10 MED ORDER — MELOXICAM 15 MG PO TABS: 15.0000 mg | ORAL_TABLET | Freq: Every day | ORAL | 1 refills | Status: DC

## 2022-09-10 MED ORDER — MELOXICAM 15 MG PO TABS: 15.0000 mg | ORAL_TABLET | Freq: Every day | ORAL | 3 refills | Status: DC

## 2022-09-10 NOTE — Patient Instructions (Addendum)
Call [facility] to schedule your mammogram and bone density at [phone].  Dermatology should be calling you in the near future for an appointment.

## 2022-09-10 NOTE — Progress Notes (Signed)
Date:  09/10/2022   Name:  Gabriela Mullins   DOB:  04-10-1956   MRN:  [id number]   Chief Complaint: Annual Exam Gabriela Mullins is a 67 y.o. female who presents today for her Complete Annual Exam. She feels well. She reports exercising while taking care of a 55 month old. She reports she is sleeping well. Breast complaints - none.  Mammogram: 10/2021 DEXA: 09/2020 osteopenia Pap smear: discontinued Colonoscopy: 12/2016 repeat 1 yr  Health Maintenance Due  Topic Date Due   COVID-19 Vaccine (3 - Moderna risk series) 01/13/2020   Medicare Annual Wellness (AWV)  10/03/2022    Immunization History  Administered Date(s) Administered   Fluad Quad(high Dose 65+) 03/29/2021, 03/13/2022   Influenza Inj Mdck Quad Pf 02/20/2019   Influenza,inj,Quad PF,6+ Mos 03/04/2020   Moderna Sars-Covid-2 Vaccination 11/18/2019, 12/16/2019   PNEUMOCOCCAL CONJUGATE-20 03/29/2021   Tdap 08/04/2018   Zoster Recombinat (Shingrix) 04/09/2019, 07/14/2019    Hyperlipidemia This is a chronic problem. The problem is controlled. Pertinent negatives include no chest pain or shortness of breath. Current antihyperlipidemic treatment includes statins.  Diabetes She presents for her follow-up diabetic visit. Diabetes type: prediabetes. Pertinent negatives for hypoglycemia include no dizziness, headaches, nervousness/anxiousness or tremors. Pertinent negatives for diabetes include no chest pain, no fatigue, no polydipsia and no polyuria.  Hip Pain  There was no injury mechanism. The pain is present in the left hip. The quality of the pain is described as aching. The pain is moderate. The pain has been Fluctuating since onset. Associated symptoms include an inability to bear weight and a loss of motion. She reports no foreign bodies present. The symptoms are aggravated by weight bearing. She has tried NSAIDs for the symptoms. The treatment provided moderate relief.    Lab Results  Component Value Date   NA 141 09/06/2021    K 3.6 09/06/2021   CO2 26 09/06/2021   GLUCOSE 101 (H) 09/06/2021   BUN 17 09/06/2021   CREATININE 0.78 09/06/2021   CALCIUM 10.3 09/06/2021   EGFR 83 09/06/2021   Lab Results  Component Value Date   CHOL 186 09/06/2021   HDL 68 09/06/2021   LDLCALC 99 09/06/2021   TRIG 109 09/06/2021   CHOLHDL 2.7 09/06/2021   Lab Results  Component Value Date   TSH 1.390 09/06/2021   Lab Results  Component Value Date   HGBA1C 5.8 (H) 09/06/2021   Lab Results  Component Value Date   WBC 6.6 09/06/2021   HGB 15.6 09/06/2021   HCT 45.6 09/06/2021   MCV 87 09/06/2021   PLT 349 09/06/2021   Lab Results  Component Value Date   ALT 17 09/06/2021   AST 18 09/06/2021   ALKPHOS 75 09/06/2021   BILITOT 0.9 09/06/2021   Lab Results  Component Value Date   VD25OH 35.5 09/06/2021     Review of Systems  Constitutional:  Negative for chills, fatigue and fever.  HENT:  Negative for congestion, hearing loss, tinnitus, trouble swallowing and voice change.   Eyes:  Negative for visual disturbance.  Respiratory:  Negative for cough, chest tightness, shortness of breath and wheezing.   Cardiovascular:  Negative for chest pain, palpitations and leg swelling.  Gastrointestinal:  Negative for abdominal pain, constipation, diarrhea and vomiting.  Endocrine: Negative for polydipsia and polyuria.  Genitourinary:  Negative for dysuria, frequency, genital sores, vaginal bleeding and vaginal discharge.  Musculoskeletal:  Positive for arthralgias. Negative for gait problem and joint swelling.  Skin:  Positive  for color change (scaling red lesion left lower leg). Negative for rash.  Neurological:  Negative for dizziness, tremors, light-headedness and headaches.  Hematological:  Negative for adenopathy. Does not bruise/bleed easily.  Psychiatric/Behavioral:  Negative for dysphoric mood and sleep disturbance. The patient is not nervous/anxious.     Patient Active Problem List   Diagnosis Date Noted    Greater trochanteric pain syndrome of left lower extremity 06/27/2021   Primary osteoarthritis of left hip 06/27/2021   Prediabetes 03/29/2021   Osteopenia determined by x-ray 03/29/2021   Hypercholesterolemia 09/30/2013   Nephrolithiasis 09/30/2013   Double voiding 04/15/2013   Incomplete emptying of bladder 04/09/2012    Allergies  Allergen Reactions   Clindamycin Hives   Levofloxacin Hives   Lidocaine Hives   Penicillins Hives   Tetracycline Hives    Past Surgical History:  Procedure Laterality Date   CHOLECYSTECTOMY  1990   EXTRACORPOREAL SHOCK WAVE LITHOTRIPSY     TONSILLECTOMY AND ADENOIDECTOMY     URETEROLITHOTOMY  1970   WISDOM TOOTH EXTRACTION Bilateral     Social History   Tobacco Use   Smoking status: Never   Smokeless tobacco: Never  Vaping Use   Vaping Use: Never used  Substance Use Topics   Alcohol use: Not Currently   Drug use: Never     Medication list has been reviewed and updated.  Current Meds  Medication Sig   Ferrous Sulfate (IRON PO) Take 1 tablet by mouth daily.   [DISCONTINUED] atorvastatin (LIPITOR) 10 MG tablet Take 1 tablet (10 mg total) by mouth daily.   [DISCONTINUED] hydrochlorothiazide (HYDRODIURIL) 50 MG tablet Take 1 tablet (50 mg total) by mouth daily.   [DISCONTINUED] meloxicam (MOBIC) 15 MG tablet TAKE 1 TABLET BY MOUTH ONCE DAILY AS NEEDED FOR PAIN (Patient taking differently: every other day.)   [DISCONTINUED] potassium citrate (UROCIT-K) 10 MEQ (1080 MG) SR tablet Take 1 tablet (10 mEq total) by mouth 2 (two) times daily with a meal.       09/10/2022    8:18 AM 03/15/2022   10:55 AM 10/31/2021   10:40 AM 09/06/2021    8:13 AM  GAD 7 : Generalized Anxiety Score  Nervous, Anxious, on Edge 0 0 0 0  Control/stop worrying 0 0 0 0  Worry too much - different things 0 0 0 0  Trouble relaxing 0 0 0 0  Restless 0 0 0 0  Easily annoyed or irritable 0 0 0 0  Afraid - awful might happen 0 0 0 0  Total GAD 7 Score 0 0 0 0   Anxiety Difficulty Not difficult at all Not difficult at all Not difficult at all Not difficult at all       09/10/2022    8:18 AM 03/15/2022   10:55 AM 10/31/2021   10:40 AM  Depression screen PHQ 2/9  Decreased Interest 1 0 1  Down, Depressed, Hopeless 0 0 0  PHQ - 2 Score 1 0 1  Altered sleeping 1 0 2  Tired, decreased energy 1 0 2  Change in appetite 0 0 1  Feeling bad or failure about yourself  0 0 0  Trouble concentrating 0 0 0  Moving slowly or fidgety/restless 0 0 0  Suicidal thoughts 0 0 0  PHQ-9 Score 3 0 6  Difficult doing work/chores Not difficult at all Not difficult at all Not difficult at all    BP Readings from Last 3 Encounters:  09/10/22 120/70  01/10/22 131/81  10/31/21 118/78  Physical Exam Vitals and nursing note reviewed.  Constitutional:      General: She is not in acute distress.    Appearance: She is well-developed.  HENT:     Head: Normocephalic and atraumatic.     Right Ear: Tympanic membrane and ear canal normal.     Left Ear: Tympanic membrane and ear canal normal.     Nose:     Right Sinus: No maxillary sinus tenderness.     Left Sinus: No maxillary sinus tenderness.  Eyes:     General: No scleral icterus.       Right eye: No discharge.        Left eye: No discharge.     Conjunctiva/sclera: Conjunctivae normal.  Neck:     Thyroid: No thyromegaly.     Vascular: No carotid bruit.  Cardiovascular:     Rate and Rhythm: Normal rate and regular rhythm.     Pulses: Normal pulses.     Heart sounds: Normal heart sounds.  Pulmonary:     Effort: Pulmonary effort is normal. No respiratory distress.     Breath sounds: No wheezing.  Chest:  Breasts:    Right: No mass, nipple discharge, skin change or tenderness.     Left: No mass, nipple discharge, skin change or tenderness.  Abdominal:     General: Bowel sounds are normal.     Palpations: Abdomen is soft.     Tenderness: There is no abdominal tenderness.  Musculoskeletal:     Cervical  back: Normal range of motion. No erythema.     Right hip: No bony tenderness. Normal range of motion.     Left hip: Bony tenderness present. Decreased range of motion.     Right lower leg: No edema.     Left lower leg: No edema.  Lymphadenopathy:     Cervical: No cervical adenopathy.  Skin:    General: Skin is warm and dry.     Capillary Refill: Capillary refill takes less than 2 seconds.     Findings: Lesion (scaly rough lesion ~ quarter sized) present. No rash.       Neurological:     General: No focal deficit present.     Mental Status: She is alert and oriented to person, place, and time.     Cranial Nerves: No cranial nerve deficit.     Sensory: No sensory deficit.     Deep Tendon Reflexes: Reflexes are normal and symmetric.  Psychiatric:        Attention and Perception: Attention normal.        Mood and Affect: Mood normal.     Wt Readings from Last 3 Encounters:  09/10/22 184 lb 9.6 oz (83.7 kg)  01/10/22 182 lb (82.6 kg)  10/31/21 180 lb 12.8 oz (82 kg)    BP 120/70   Pulse 88   Ht 5\' 8"  (1.727 m)   Wt 184 lb 9.6 oz (83.7 kg)   SpO2 95%   BMI 28.07 kg/m   Assessment and Plan:  Problem List Items Addressed This Visit       Musculoskeletal and Integument   Osteopenia determined by x-ray    Will repeat DEXA Recommend Calcium and Vitamin D daily      Relevant Orders   DG Bone Density   Primary osteoarthritis of left hip   Relevant Medications   meloxicam (MOBIC) 15 MG tablet   Other Relevant Orders   DG Hip Unilat W OR W/O Pelvis 2-3 Views Left  Genitourinary   Nephrolithiasis    On HCTZ and Urocit K  No recent renal stones      Relevant Medications   hydrochlorothiazide (HYDRODIURIL) 50 MG tablet   potassium citrate (UROCIT-K) 10 MEQ (1080 MG) SR tablet     Other   Hypercholesterolemia - Primary (Chronic)    Tolerating statin medications without concerns LDL is  Lab Results  Component Value Date   LDLCALC 99 09/06/2021  with a goal of  < 70. Current dose will be adjusted if needed.       Relevant Medications   atorvastatin (LIPITOR) 10 MG tablet   hydrochlorothiazide (HYDRODIURIL) 50 MG tablet   Other Relevant Orders   Lipid panel   Prediabetes (Chronic)    Clinically stable on diet alone Lab Results  Component Value Date   HGBA1C 5.8 (H) 09/06/2021        Relevant Orders   Comprehensive metabolic panel   Hemoglobin A1c   Other Visit Diagnoses     Annual physical exam       Encounter for screening mammogram for breast cancer       Relevant Orders   MM 3D SCREENING MAMMOGRAM BILATERAL BREAST   Encounter for long-term (current) use of NSAIDs       Relevant Orders   CBC with Differential/Platelet   Comprehensive metabolic panel   Neoplasm of uncertain behavior of skin       refer to Dermatology   Relevant Orders   Ambulatory referral to Dermatology       Return in about 6 months (around 03/12/2023) for OA/mobic Rx.   Partially dictated using Dragon software, any errors are not intentional.  Reubin MilanLaura H. Clance Baquero, MD [facility] [place], [place]

## 2022-09-10 NOTE — Assessment & Plan Note (Signed)
On HCTZ and Urocit K  No recent renal stones

## 2022-09-10 NOTE — Assessment & Plan Note (Signed)
Clinically stable on diet alone Lab Results  Component Value Date   HGBA1C 5.8 (H) 09/06/2021

## 2022-09-10 NOTE — Assessment & Plan Note (Signed)
Tolerating statin medications without concerns LDL is  Lab Results  Component Value Date   LDLCALC 99 09/06/2021   with a goal of < 70. Current dose will be adjusted if needed.

## 2022-09-10 NOTE — Assessment & Plan Note (Signed)
Will repeat DEXA Recommend Calcium and Vitamin D daily

## 2022-09-11 LAB — COMPREHENSIVE METABOLIC PANEL
ALT: 22 IU/L (ref 0–32)
AST: 19 IU/L (ref 0–40)
Albumin/Globulin Ratio: 1.8 (ref 1.2–2.2)
Albumin: 4.6 g/dL (ref 3.9–4.9)
Alkaline Phosphatase: 85 IU/L (ref 44–121)
BUN/Creatinine Ratio: 25 (ref 12–28)
BUN: 19 mg/dL (ref 8–27)
Bilirubin Total: 0.8 mg/dL (ref 0.0–1.2)
CO2: 23 mmol/L (ref 20–29)
Calcium: 10.4 mg/dL — ABNORMAL HIGH (ref 8.7–10.3)
Chloride: 101 mmol/L (ref 96–106)
Creatinine, Ser: 0.75 mg/dL (ref 0.57–1.00)
Globulin, Total: 2.6 g/dL (ref 1.5–4.5)
Glucose: 98 mg/dL (ref 70–99)
Potassium: 4.4 mmol/L (ref 3.5–5.2)
Sodium: 142 mmol/L (ref 134–144)
Total Protein: 7.2 g/dL (ref 6.0–8.5)
eGFR: 87 mL/min/{1.73_m2} (ref >59)

## 2022-09-11 LAB — CBC WITH DIFFERENTIAL/PLATELET
Basophils Absolute: 0.1 10*3/uL (ref 0.0–0.2)
Basos: 1 %
EOS (ABSOLUTE): 0.2 10*3/uL (ref 0.0–0.4)
Eos: 3 %
Hematocrit: 46.8 % — ABNORMAL HIGH (ref 34.0–46.6)
Hemoglobin: 15.6 g/dL (ref 11.1–15.9)
Immature Grans (Abs): 0 10*3/uL (ref 0.0–0.1)
Immature Granulocytes: 0 %
Lymphocytes Absolute: 2.5 10*3/uL (ref 0.7–3.1)
Lymphs: 37 %
MCH: 29 pg (ref 26.6–33.0)
MCHC: 33.3 g/dL (ref 31.5–35.7)
MCV: 87 fL (ref 79–97)
Monocytes Absolute: 0.7 10*3/uL (ref 0.1–0.9)
Monocytes: 10 %
Neutrophils Absolute: 3.3 10*3/uL (ref 1.4–7.0)
Neutrophils: 49 %
Platelets: 341 10*3/uL (ref 150–450)
RBC: 5.38 x10E6/uL — ABNORMAL HIGH (ref 3.77–5.28)
RDW: 12.4 % (ref 11.7–15.4)
WBC: 6.8 10*3/uL (ref 3.4–10.8)

## 2022-09-11 LAB — LIPID PANEL
Chol/HDL Ratio: 3.2 ratio (ref 0.0–4.4)
Cholesterol, Total: 175 mg/dL (ref 100–199)
HDL: 55 mg/dL (ref >39)
LDL Chol Calc (NIH): 92 mg/dL (ref 0–99)
Triglycerides: 162 mg/dL — ABNORMAL HIGH (ref 0–149)
VLDL Cholesterol Cal: 28 mg/dL (ref 5–40)

## 2022-09-11 LAB — HEMOGLOBIN A1C
Est. average glucose Bld gHb Est-mCnc: 120 mg/dL
Hgb A1c MFr Bld: 5.8 % — ABNORMAL HIGH (ref 4.8–5.6)

## 2022-09-12 ENCOUNTER — Other Ambulatory Visit: Payer: Self-pay

## 2022-09-12 DIAGNOSIS — M167 Other unilateral secondary osteoarthritis of hip: Secondary | ICD-10-CM

## 2022-09-13 ENCOUNTER — Encounter: Payer: Self-pay | Admitting: Internal Medicine

## 2022-09-13 DIAGNOSIS — L570 Actinic keratosis: Secondary | ICD-10-CM | POA: Diagnosis not present

## 2022-09-13 DIAGNOSIS — L578 Other skin changes due to chronic exposure to nonionizing radiation: Secondary | ICD-10-CM | POA: Diagnosis not present

## 2022-09-13 DIAGNOSIS — D485 Neoplasm of uncertain behavior of skin: Secondary | ICD-10-CM | POA: Diagnosis not present

## 2022-09-19 DIAGNOSIS — M1612 Unilateral primary osteoarthritis, left hip: Secondary | ICD-10-CM | POA: Diagnosis not present

## 2022-10-02 ENCOUNTER — Encounter: Payer: Self-pay | Admitting: Internal Medicine

## 2022-11-07 DIAGNOSIS — M25552 Pain in left hip: Secondary | ICD-10-CM | POA: Diagnosis not present

## 2022-11-07 DIAGNOSIS — R262 Difficulty in walking, not elsewhere classified: Secondary | ICD-10-CM | POA: Diagnosis not present

## 2022-11-08 ENCOUNTER — Ambulatory Visit
Admission: RE | Admit: 2022-11-08 | Discharge: 2022-11-08 | Disposition: A | Discharge status: Home / Self Care | Payer: Medicare PPO | Source: Ambulatory Visit | Attending: Internal Medicine | Admitting: Internal Medicine

## 2022-11-08 ENCOUNTER — Telehealth: Payer: Self-pay | Admitting: Urology

## 2022-11-08 DIAGNOSIS — Z13820 Encounter for screening for osteoporosis: Secondary | ICD-10-CM | POA: Insufficient documentation

## 2022-11-08 DIAGNOSIS — Z780 Asymptomatic menopausal state: Secondary | ICD-10-CM | POA: Diagnosis not present

## 2022-11-08 DIAGNOSIS — M8589 Other specified disorders of bone density and structure, multiple sites: Secondary | ICD-10-CM | POA: Diagnosis not present

## 2022-11-08 DIAGNOSIS — Z1231 Encounter for screening mammogram for malignant neoplasm of breast: Secondary | ICD-10-CM | POA: Insufficient documentation

## 2022-11-08 NOTE — Telephone Encounter (Signed)
Patient called and stated that she has recently had a bone density test, and her primary doctor (Dr. Asencion Partridge) told her she should start taking a calcium supplement in addition to Vitamin D which she already takes. Patient said that she was told years ago by Dr. Achilles Dunk (former Urologist) that calcium supplements can contribute to kidney stones. Patient would like Dr. Heywood Footman opinion on taking calcium supplement or is Vitamin D enough? Please advise patient.

## 2022-11-08 NOTE — Telephone Encounter (Signed)
Okay to take a calcium supplement but would recommend taking calcium citrate.  This is over-the-counter and the pharmacist can help her locate

## 2022-11-09 ENCOUNTER — Encounter: Payer: Self-pay | Admitting: *Deleted

## 2022-11-09 NOTE — Telephone Encounter (Signed)
Send mychart message

## 2022-11-12 ENCOUNTER — Encounter: Payer: Self-pay | Admitting: Internal Medicine

## 2022-11-15 DIAGNOSIS — Z96642 Presence of left artificial hip joint: Secondary | ICD-10-CM | POA: Diagnosis not present

## 2022-11-15 DIAGNOSIS — M1612 Unilateral primary osteoarthritis, left hip: Secondary | ICD-10-CM | POA: Diagnosis not present

## 2022-11-15 DIAGNOSIS — Z4789 Encounter for other orthopedic aftercare: Secondary | ICD-10-CM | POA: Diagnosis not present

## 2022-11-15 HISTORY — PX: TOTAL HIP ARTHROPLASTY: SHX124

## 2022-11-16 DIAGNOSIS — M25552 Pain in left hip: Secondary | ICD-10-CM | POA: Diagnosis not present

## 2022-11-16 DIAGNOSIS — R262 Difficulty in walking, not elsewhere classified: Secondary | ICD-10-CM | POA: Diagnosis not present

## 2022-11-19 ENCOUNTER — Encounter: Payer: Self-pay | Admitting: Internal Medicine

## 2022-11-22 DIAGNOSIS — M25652 Stiffness of left hip, not elsewhere classified: Secondary | ICD-10-CM | POA: Diagnosis not present

## 2022-11-22 DIAGNOSIS — R262 Difficulty in walking, not elsewhere classified: Secondary | ICD-10-CM | POA: Diagnosis not present

## 2022-11-22 DIAGNOSIS — M25552 Pain in left hip: Secondary | ICD-10-CM | POA: Diagnosis not present

## 2022-11-27 DIAGNOSIS — R262 Difficulty in walking, not elsewhere classified: Secondary | ICD-10-CM | POA: Diagnosis not present

## 2022-11-27 DIAGNOSIS — M25552 Pain in left hip: Secondary | ICD-10-CM | POA: Diagnosis not present

## 2022-11-27 DIAGNOSIS — M25652 Stiffness of left hip, not elsewhere classified: Secondary | ICD-10-CM | POA: Diagnosis not present

## 2022-11-30 DIAGNOSIS — M25652 Stiffness of left hip, not elsewhere classified: Secondary | ICD-10-CM | POA: Diagnosis not present

## 2022-11-30 DIAGNOSIS — R262 Difficulty in walking, not elsewhere classified: Secondary | ICD-10-CM | POA: Diagnosis not present

## 2022-11-30 DIAGNOSIS — M25552 Pain in left hip: Secondary | ICD-10-CM | POA: Diagnosis not present

## 2022-12-05 DIAGNOSIS — R262 Difficulty in walking, not elsewhere classified: Secondary | ICD-10-CM | POA: Diagnosis not present

## 2022-12-05 DIAGNOSIS — M25652 Stiffness of left hip, not elsewhere classified: Secondary | ICD-10-CM | POA: Diagnosis not present

## 2022-12-05 DIAGNOSIS — M25552 Pain in left hip: Secondary | ICD-10-CM | POA: Diagnosis not present

## 2022-12-10 DIAGNOSIS — M25552 Pain in left hip: Secondary | ICD-10-CM | POA: Diagnosis not present

## 2022-12-10 DIAGNOSIS — M25652 Stiffness of left hip, not elsewhere classified: Secondary | ICD-10-CM | POA: Diagnosis not present

## 2022-12-10 DIAGNOSIS — R262 Difficulty in walking, not elsewhere classified: Secondary | ICD-10-CM | POA: Diagnosis not present

## 2022-12-12 DIAGNOSIS — M25552 Pain in left hip: Secondary | ICD-10-CM | POA: Diagnosis not present

## 2022-12-12 DIAGNOSIS — R262 Difficulty in walking, not elsewhere classified: Secondary | ICD-10-CM | POA: Diagnosis not present

## 2022-12-12 DIAGNOSIS — M25652 Stiffness of left hip, not elsewhere classified: Secondary | ICD-10-CM | POA: Diagnosis not present

## 2022-12-19 DIAGNOSIS — Z96642 Presence of left artificial hip joint: Secondary | ICD-10-CM | POA: Diagnosis not present

## 2023-01-30 ENCOUNTER — Ambulatory Visit (INDEPENDENT_AMBULATORY_CARE_PROVIDER_SITE_OTHER): Payer: Medicare PPO

## 2023-01-30 VITALS — Ht 68.0 in | Wt 184.0 lb

## 2023-01-30 DIAGNOSIS — Z0000 Encounter for general adult medical examination without abnormal findings: Secondary | ICD-10-CM

## 2023-01-30 NOTE — Progress Notes (Signed)
Subjective:   Gabriela Mullins is a 67 y.o. female who presents for Medicare Annual (Subsequent) preventive examination.  Visit Complete: Virtual  I connected with  Gabriela Mullins on 01/30/23 by a audio enabled telemedicine application and verified that I am speaking with the correct person using two identifiers.  Patient Location: Home  Provider Location: Office/Clinic  I discussed the limitations of evaluation and management by telemedicine. The patient expressed understanding and agreed to proceed.   Cardiac Risk Factors include: advanced age (>46men, >15 women);dyslipidemia;obesity (BMI >30kg/m2)     Objective:    Today's Vitals   01/30/23 0948  Weight: 184 lb (83.5 kg)  Height: 5\' 8"  (1.727 m)   Body mass index is 27.98 kg/m.     01/30/2023    9:57 AM 10/02/2021    1:14 PM 11/11/2020    6:59 PM  Advanced Directives  Does Patient Have a Medical Advance Directive? No No No  Would patient like information on creating a medical advance directive? No - Patient declined No - Patient declined     Current Medications (verified) Outpatient Encounter Medications as of 01/30/2023  Medication Sig   atorvastatin (LIPITOR) 10 MG tablet Take 1 tablet (10 mg total) by mouth daily.   Calcium Citrate-Vitamin D (CALCIUM CITRATE + D3) 250-5 MG-MCG TABS Take by mouth.   Ferrous Sulfate (IRON PO) Take 1 tablet by mouth daily.   hydrochlorothiazide (HYDRODIURIL) 50 MG tablet Take 1 tablet (50 mg total) by mouth daily.   potassium citrate (UROCIT-K) 10 MEQ (1080 MG) SR tablet Take 1 tablet (10 mEq total) by mouth 2 (two) times daily with a meal.   [DISCONTINUED] meloxicam (MOBIC) 15 MG tablet Take 1 tablet (15 mg total) by mouth daily.   No facility-administered encounter medications on file as of 01/30/2023.    Allergies (verified) Clindamycin, Levofloxacin, Penicillins, and Tetracycline   History: Past Medical History:  Diagnosis Date   Hyperlipidemia    Nephrolithiasis     Osteopenia    Past Surgical History:  Procedure Laterality Date   CHOLECYSTECTOMY  1990   EXTRACORPOREAL SHOCK WAVE LITHOTRIPSY     JOINT REPLACEMENT     TONSILLECTOMY AND ADENOIDECTOMY     TOTAL HIP ARTHROPLASTY Left    URETEROLITHOTOMY  1970   WISDOM TOOTH EXTRACTION Bilateral    Family History  Problem Relation Age of Onset   Arthritis Mother    Cancer Mother    Dementia Mother    Heart attack Father    Heart murmur Father    Hernia Brother    Factor V Leiden deficiency Daughter    Clotting disorder Daughter    Heart attack Paternal Grandfather    Breast cancer Neg Hx    Social History   Socioeconomic History   Marital status: Married    Spouse name: Gabriela Mullins   Number of children: 2   Years of education: 16   Highest education level: Bachelor's degree (e.g., BA, AB, BS)  Occupational History   Occupation: Retired  Tobacco Use   Smoking status: Never   Smokeless tobacco: Never  Vaping Use   Vaping status: Never Used  Substance and Sexual Activity   Alcohol use: Not Currently   Drug use: Never   Sexual activity: Yes    Partners: Male  Other Topics Concern   Not on file  Social History Narrative   Not on file   Social Determinants of Health   Financial Resource Strain: Low Risk  (01/30/2023)  Overall Financial Resource Strain (CARDIA)    Difficulty of Paying Living Expenses: Not hard at all  Food Insecurity: No Food Insecurity (01/30/2023)   Hunger Vital Sign    Worried About Running Out of Food in the Last Year: Never true    Ran Out of Food in the Last Year: Never true  Transportation Needs: No Transportation Needs (01/30/2023)   PRAPARE - Administrator, Civil Service (Medical): No    Lack of Transportation (Non-Medical): No  Physical Activity: Insufficiently Active (01/30/2023)   Exercise Vital Sign    Days of Exercise per Week: 4 days    Minutes of Exercise per Session: 30 min  Stress: No Stress Concern Present (01/30/2023)    [employer] of Occupational Health - Occupational Stress Questionnaire    Feeling of Stress : Not at all  Social Connections: Socially Integrated (01/30/2023)   Social Connection and Isolation Panel [NHANES]    Frequency of Communication with Friends and Family: Three times a week    Frequency of Social Gatherings with Friends and Family: Twice a week    Attends Religious Services: More than 4 times per year    Active Member of [employer] or Organizations: Yes    Attends Engineer, structural: More than 4 times per year    Marital Status: Married    Tobacco Counseling Counseling given: Yes   Clinical Intake:  Pre-visit preparation completed: No  Pain : No/denies pain     BMI - recorded: 27.98 Nutritional Status: BMI 25 -29 Overweight Nutritional Risks: None Diabetes: No  How often do you need to have someone help you when you read instructions, pamphlets, or other written materials from your doctor or pharmacy?: 1 - Never  Interpreter Needed?: No  Information entered by :: Arthur Holms   Activities of Daily Living    01/30/2023    9:49 AM  In your present state of health, do you have any difficulty performing the following activities:  Hearing? 0  Vision? 0  Comment presription sunglasses  Difficulty concentrating or making decisions? 0  Walking or climbing stairs? 0  Dressing or bathing? 0  Doing errands, shopping? 0  Preparing Food and eating ? N  Using the Toilet? N  In the past six months, have you accidently leaked urine? N  Do you have problems with loss of bowel control? N  Managing your Medications? N  Managing your Finances? N  Housekeeping or managing your Housekeeping? N    Patient Care Team: Reubin Milan, MD as PCP - General (Internal Medicine) Riki Altes, MD (Urology)  Indicate any recent Medical Services you may have received from other than [facility] providers in the past year (date may be approximate).     Assessment:   This  is a routine wellness examination for Gabriela Mullins.  Hearing/Vision screen Hearing Screening - Comments:: Hearing well over phone Vision Screening - Comments:: Prescription sunglasses  Dietary issues and exercise activities discussed:     Goals Addressed               This Visit's Progress     Exercise 3x per week (30 min per time) (pt-stated)        Taking care of grandkid       Depression Screen    01/30/2023    9:59 AM 01/30/2023    9:56 AM 09/10/2022    8:18 AM 03/15/2022   10:55 AM 10/31/2021   10:40 AM 10/02/2021    1:12  PM 09/06/2021    8:13 AM  PHQ 2/9 Scores  PHQ - 2 Score 0 0 1 0 1 0 1  PHQ- 9 Score   3 0 6 2 3     Fall Risk    01/30/2023    9:58 AM 09/10/2022    8:19 AM 03/15/2022   10:55 AM 10/31/2021   10:41 AM 10/02/2021    1:14 PM  Fall Risk   Falls in the past year? 0 0 0 0 0  Number falls in past yr: 0 0 0 0 0  Injury with Fall? 0 0 0 0 0  Risk for fall due to : No Fall Risks No Fall Risks No Fall Risks No Fall Risks No Fall Risks  Follow up Falls evaluation completed Falls evaluation completed Falls evaluation completed Falls evaluation completed Falls prevention discussed    MEDICARE RISK AT HOME: Medicare Risk at Home Any stairs in or around the home?: Yes If so, are there any without handrails?: No Home free of loose throw rugs in walkways, pet beds, electrical cords, etc?: Yes Adequate lighting in your home to reduce risk of falls?: Yes Life alert?: No (advised to get) Use of a cane, walker or w/c?: No Grab bars in the bathroom?: Yes Shower chair or bench in shower?: Yes Elevated toilet seat or a handicapped toilet?: Yes    Cognitive Function:        01/30/2023    9:59 AM  6CIT Screen  What Year? 0 points  What month? 0 points  What time? 0 points  Count back from 20 0 points  Months in reverse 0 points  Repeat phrase 0 points  Total Score 0 points    Immunizations Immunization History  Administered Date(s) Administered   Fluad  Quad(high Dose 65+) 03/29/2021, 03/13/2022   Influenza Inj Mdck Quad Pf 02/20/2019   Influenza,inj,Quad PF,6+ Mos 03/04/2020   Moderna Sars-Covid-2 Vaccination 11/18/2019, 12/16/2019   PNEUMOCOCCAL CONJUGATE-20 03/29/2021   Tdap 08/04/2018   Zoster Recombinant(Shingrix) 04/09/2019, 07/14/2019    TDAP status: Up to date  Flu Vaccine status: Due, Education has been provided regarding the importance of this vaccine. Advised may receive this vaccine at local pharmacy or Health Dept. Aware to provide a copy of the vaccination record if obtained from local pharmacy or Health Dept. Verbalized acceptance and understanding.  Pneumococcal vaccine status: Up to date  Covid-19 vaccine status: Declined, Education has been provided regarding the importance of this vaccine but patient still declined. Advised may receive this vaccine at local pharmacy or Health Dept.or vaccine clinic. Aware to provide a copy of the vaccination record if obtained from local pharmacy or Health Dept. Verbalized acceptance and understanding.  Qualifies for Shingles Vaccine? Yes   Zostavax completed No   Shingrix Completed?: Yes  Screening Tests Health Maintenance  Topic Date Due   INFLUENZA VACCINE  02/07/2023 (Originally 01/03/2023)   COVID-19 Vaccine (3 - Moderna risk series) 02/15/2023 (Originally 01/13/2020)   MAMMOGRAM  11/08/2023   Medicare Annual Wellness (AWV)  01/30/2024   Colonoscopy  12/16/2026   DTaP/Tdap/Td (2 - Td or Tdap) 08/03/2028   Pneumonia Vaccine 80+ Years old  Completed   DEXA SCAN  Completed   Hepatitis C Screening  Completed   Zoster Vaccines- Shingrix  Completed   HPV VACCINES  Aged Out    Health Maintenance  There are no preventive care reminders to display for this patient.   Colorectal cancer screening: Type of screening: Colonoscopy. Completed 12/15/2016. Repeat every 10  years  Mammogram status: Completed 11/08/2022. Repeat every year  Bone Density status: Completed 11/08/22.  Results reflect: Bone density results: OSTEOPENIA. Repeat every 2 years.  Lung Cancer Screening: (Low Dose CT Chest recommended if Age 10-80 years, 20 pack-year currently smoking OR have quit w/in 15years.) does not qualify.    Additional Screening:  Hepatitis C Screening: does not qualify; Completed 09/26/21  Vision Screening: Recommended annual ophthalmology exams for early detection of glaucoma and other disorders of the eye. Is the patient up to date with their annual eye exam?  Yes  Who is the provider or what is the name of the office in which the patient attends annual eye exams? Dr Clearance Coots   Dental Screening: Recommended annual dental exams for proper oral hygiene-- Dr Liberty Handy Resource Referral / Chronic Care Management: CRR required this visit?  No   CCM required this visit?  No     Plan:     I have personally reviewed and noted the following in the patient's chart:   Medical and social history Use of alcohol, tobacco or illicit drugs  Current medications and supplements including opioid prescriptions. Patient is not currently taking opioid prescriptions. Functional ability and status Nutritional status Physical activity Advanced directives List of other physicians Hospitalizations, surgeries, and ER visits in previous 12 months Vitals Screenings to include cognitive, depression, and falls Referrals and appointments  In addition, I have reviewed and discussed with patient certain preventive protocols, quality metrics, and best practice recommendations. A written personalized care plan for preventive services as well as general preventive health recommendations were provided to patient.     Everitt Amber   01/30/2023   After Visit Summary: (MyChart) Due to this being a telephonic visit, the after visit summary with patients personalized plan was offered to patient via MyChart   Nurse Notes: none

## 2023-01-30 NOTE — Patient Instructions (Signed)
Gabriela Mullins , Thank you for taking time to come for your Medicare Wellness Visit. I appreciate your ongoing commitment to your health goals. Please review the following plan we discussed and let me know if I can assist you in the future.   These are the goals we discussed:  Goals       Exercise 3x per week (30 min per time) (pt-stated)      Taking care of grandkid      Weight (lb) < 165 lb (74.8 kg)      Pt states she would like lose weight over the next year with healthy eating and physical activity         This is a list of the screening recommended for you and due dates:  Health Maintenance  Topic Date Due   Flu Shot  02/07/2023*   COVID-19 Vaccine (3 - Moderna risk series) 02/15/2023*   Mammogram  11/08/2023   Medicare Annual Wellness Visit  01/30/2024   Colon Cancer Screening  12/16/2026   DTaP/Tdap/Td vaccine (2 - Td or Tdap) 08/03/2028   Pneumonia Vaccine  Completed   DEXA scan (bone density measurement)  Completed   Hepatitis C Screening  Completed   Zoster (Shingles) Vaccine  Completed   HPV Vaccine  Aged Out  *Topic was postponed. The date shown is not the original due date.   Health Maintenance After Age 60 After age 63, you are at a higher risk for certain long-term diseases and infections as well as injuries from falls. Falls are a major cause of broken bones and head injuries in people who are older than age 72. Getting regular preventive care can help to keep you healthy and well. Preventive care includes getting regular testing and making lifestyle changes as recommended by your health care provider. Talk with your health care provider about: Which screenings and tests you should have. A screening is a test that checks for a disease when you have no symptoms. A diet and exercise plan that is right for you. What should I know about screenings and tests to prevent falls? Screening and testing are the best ways to find a health problem early. Early diagnosis and  treatment give you the best chance of managing medical conditions that are common after age 32. Certain conditions and lifestyle choices may make you more likely to have a fall. Your health care provider may recommend: Regular vision checks. Poor vision and conditions such as cataracts can make you more likely to have a fall. If you wear glasses, make sure to get your prescription updated if your vision changes. Medicine review. Work with your health care provider to regularly review all of the medicines you are taking, including over-the-counter medicines. Ask your health care provider about any side effects that may make you more likely to have a fall. Tell your health care provider if any medicines that you take make you feel dizzy or sleepy. Strength and balance checks. Your health care provider may recommend certain tests to check your strength and balance while standing, walking, or changing positions. Foot health exam. Foot pain and numbness, as well as not wearing proper footwear, can make you more likely to have a fall. Screenings, including: Osteoporosis screening. Osteoporosis is a condition that causes the bones to get weaker and break more easily. Blood pressure screening. Blood pressure changes and medicines to control blood pressure can make you feel dizzy. Depression screening. You may be more likely to have a fall if you  have a fear of falling, feel depressed, or feel unable to do activities that you used to do. Alcohol use screening. Using too much alcohol can affect your balance and may make you more likely to have a fall. Follow these instructions at home: Lifestyle Do not drink alcohol if: Your health care provider tells you not to drink. If you drink alcohol: Limit how much you have to: 0-1 drink a day for women. 0-2 drinks a day for men. Know how much alcohol is in your drink. In the [place] one drink equals one 12 oz bottle of beer (355 mL), one 5 oz glass of wine (148 mL), or  one 1 oz glass of hard liquor (44 mL). Do not use any products that contain nicotine or tobacco. These products include cigarettes, chewing tobacco, and vaping devices, such as e-cigarettes. If you need help quitting, ask your health care provider. Activity  Follow a regular exercise program to stay fit. This will help you maintain your balance. Ask your health care provider what types of exercise are appropriate for you. If you need a cane or walker, use it as recommended by your health care provider. Wear supportive shoes that have nonskid soles. Safety  Remove any tripping hazards, such as rugs, cords, and clutter. Install safety equipment such as grab bars in bathrooms and safety rails on stairs. Keep rooms and walkways well-lit. General instructions Talk with your health care provider about your risks for falling. Tell your health care provider if: You fall. Be sure to tell your health care provider about all falls, even ones that seem minor. You feel dizzy, tiredness (fatigue), or off-balance. Take over-the-counter and prescription medicines only as told by your health care provider. These include supplements. Eat a healthy diet and maintain a healthy weight. A healthy diet includes low-fat dairy products, low-fat (lean) meats, and fiber from whole grains, beans, and lots of fruits and vegetables. Stay current with your vaccines. Schedule regular health, dental, and eye exams. Summary Having a healthy lifestyle and getting preventive care can help to protect your health and wellness after age 87. Screening and testing are the best way to find a health problem early and help you avoid having a fall. Early diagnosis and treatment give you the best chance for managing medical conditions that are more common for people who are older than age 76. Falls are a major cause of broken bones and head injuries in people who are older than age 77. Take precautions to prevent a fall at home. Work  with your health care provider to learn what changes you can make to improve your health and wellness and to prevent falls. This information is not intended to replace advice given to you by your health care provider. Make sure you discuss any questions you have with your health care provider. Document Revised: 10/10/2020 Document Reviewed: 10/10/2020 Elsevier Patient Education  2024 [employer].

## 2023-03-14 ENCOUNTER — Ambulatory Visit: Payer: Medicare PPO | Admitting: Internal Medicine

## 2023-03-21 ENCOUNTER — Ambulatory Visit: Payer: Medicare PPO | Admitting: Internal Medicine

## 2023-03-21 ENCOUNTER — Encounter: Payer: Self-pay | Admitting: Internal Medicine

## 2023-03-21 VITALS — BP 112/70 | HR 65 | Ht 68.0 in | Wt 186.0 lb

## 2023-03-21 DIAGNOSIS — F5101 Primary insomnia: Secondary | ICD-10-CM | POA: Insufficient documentation

## 2023-03-21 DIAGNOSIS — R7303 Prediabetes: Secondary | ICD-10-CM

## 2023-03-21 DIAGNOSIS — E7800 Pure hypercholesterolemia, unspecified: Secondary | ICD-10-CM

## 2023-03-21 DIAGNOSIS — N200 Calculus of kidney: Secondary | ICD-10-CM

## 2023-03-21 DIAGNOSIS — Z23 Encounter for immunization: Secondary | ICD-10-CM

## 2023-03-21 MED ORDER — HYDROCHLOROTHIAZIDE 50 MG PO TABS: 50.0000 mg | ORAL_TABLET | Freq: Every day | ORAL | 3 refills | Status: DC

## 2023-03-21 NOTE — Assessment & Plan Note (Signed)
Not currently working much with diet. 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 Lab Results  Component Value Date   HGBA1C 5.8 (H) 09/10/2022

## 2023-03-21 NOTE — Patient Instructions (Signed)
For sleep - try Melatonin up to 10 mg at bedtime OR  Simply Sleep/Zquil 25 mg at bedtime.

## 2023-03-21 NOTE — Progress Notes (Signed)
Date:  03/21/2023   Name:  Gabriela Mullins   DOB:  21-Jul-1955   MRN:  [id number]   Chief Complaint: Hyperlipidemia  Hyperlipidemia This is a chronic problem. The problem is controlled. Pertinent negatives include no chest pain or shortness of breath. Current antihyperlipidemic treatment includes statins. The current treatment provides significant improvement of lipids.  Diabetes She presents for her follow-up diabetic visit. Diabetes type: prediabetes. Pertinent negatives for hypoglycemia include no dizziness, headaches or nervousness/anxiousness. Pertinent negatives for diabetes include no chest pain, no fatigue and no weakness. When asked about current treatments, none (has not been good with diet recently) were reported.  Insomnia Primary symptoms: sleep disturbance, premature morning awakening.   The problem occurs nightly. The problem is unchanged.   Hx Kidney stones - on Urocit K and HCTZ for prevention.  Still has stones but they are stable and none have moved.  She denies muscle cramps on hydrochlorothiazide.  Review of Systems  Constitutional:  Negative for chills, fatigue and unexpected weight change.  HENT:  Negative for nosebleeds.   Eyes:  Negative for visual disturbance.  Respiratory:  Negative for cough, chest tightness, shortness of breath and wheezing.   Cardiovascular:  Negative for chest pain, palpitations and leg swelling.  Gastrointestinal:  Negative for abdominal pain, constipation and diarrhea.  Neurological:  Negative for dizziness, weakness, light-headedness and headaches.  Psychiatric/Behavioral:  Positive for sleep disturbance. Negative for dysphoric mood. The patient has insomnia. The patient is not nervous/anxious.      Lab Results  Component Value Date   NA 142 09/10/2022   K 4.4 09/10/2022   CO2 23 09/10/2022   GLUCOSE 98 09/10/2022   BUN 19 09/10/2022   CREATININE 0.75 09/10/2022   CALCIUM 10.4 (H) 09/10/2022   EGFR 87 09/10/2022   Lab  Results  Component Value Date   CHOL 175 09/10/2022   HDL 55 09/10/2022   LDLCALC 92 09/10/2022   TRIG 162 (H) 09/10/2022   CHOLHDL 3.2 09/10/2022   Lab Results  Component Value Date   TSH 1.390 09/06/2021   Lab Results  Component Value Date   HGBA1C 5.8 (H) 09/10/2022   Lab Results  Component Value Date   WBC 6.8 09/10/2022   HGB 15.6 09/10/2022   HCT 46.8 (H) 09/10/2022   MCV 87 09/10/2022   PLT 341 09/10/2022   Lab Results  Component Value Date   ALT 22 09/10/2022   AST 19 09/10/2022   ALKPHOS 85 09/10/2022   BILITOT 0.8 09/10/2022   Lab Results  Component Value Date   VD25OH 35.5 09/06/2021     Patient Active Problem List   Diagnosis Date Noted   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/2023   Greater trochanteric pain syndrome of left lower extremity 06/27/2021   Prediabetes 03/29/2021   Osteopenia determined by x-ray 03/29/2021   Hypercholesterolemia 09/30/2013   Nephrolithiasis 09/30/2013   Double voiding 04/15/2013   Incomplete emptying of bladder 04/09/2012    Allergies  Allergen Reactions   Clindamycin Hives   Levofloxacin Hives   Penicillins Hives   Tetracycline Hives    Past Surgical History:  Procedure Laterality Date   CHOLECYSTECTOMY  1990   EXTRACORPOREAL SHOCK WAVE LITHOTRIPSY     TONSILLECTOMY AND ADENOIDECTOMY     TOTAL HIP ARTHROPLASTY Left 11/15/2022   URETEROLITHOTOMY  1970   WISDOM TOOTH EXTRACTION Bilateral     Social History   Tobacco Use   Smoking status: Never   Smokeless tobacco: Never  Vaping Use  Vaping status: Never Used  Substance Use Topics   Alcohol use: Not Currently   Drug use: Never     Medication list has been reviewed and updated.  Current Meds  Medication Sig   atorvastatin (LIPITOR) 10 MG tablet Take 1 tablet (10 mg total) by mouth daily.   Calcium Citrate-Vitamin D (CALCIUM CITRATE + D3) 250-5 MG-MCG TABS Take by mouth.   Ferrous Sulfate (IRON PO) Take 1 tablet by mouth daily.   potassium citrate (UROCIT-K)  10 MEQ (1080 MG) SR tablet Take 1 tablet (10 mEq total) by mouth 2 (two) times daily with a meal.   [DISCONTINUED] hydrochlorothiazide (HYDRODIURIL) 50 MG tablet Take 1 tablet (50 mg total) by mouth daily.   [DISCONTINUED] meloxicam (MOBIC) 15 MG tablet Take 15 mg by mouth daily.       09/10/2022    8:18 AM 03/15/2022   10:55 AM 10/31/2021   10:40 AM 09/06/2021    8:13 AM  GAD 7 : Generalized Anxiety Score  Nervous, Anxious, on Edge 0 0 0 0  Control/stop worrying 0 0 0 0  Worry too much - different things 0 0 0 0  Trouble relaxing 0 0 0 0  Restless 0 0 0 0  Easily annoyed or irritable 0 0 0 0  Afraid - awful might happen 0 0 0 0  Total GAD 7 Score 0 0 0 0  Anxiety Difficulty Not difficult at all Not difficult at all Not difficult at all Not difficult at all       01/30/2023    9:59 AM 01/30/2023    9:56 AM 09/10/2022    8:18 AM  Depression screen PHQ 2/9  Decreased Interest 0 0 1  Down, Depressed, Hopeless 0 0 0  PHQ - 2 Score 0 0 1  Altered sleeping   1  Tired, decreased energy   1  Change in appetite   0  Feeling bad or failure about yourself    0  Trouble concentrating   0  Moving slowly or fidgety/restless   0  Suicidal thoughts   0  PHQ-9 Score   3  Difficult doing work/chores   Not difficult at all    BP Readings from Last 3 Encounters:  03/21/23 112/70  09/10/22 120/70  01/10/22 131/81    Physical Exam Vitals and nursing note reviewed.  Constitutional:      General: She is not in acute distress.    Appearance: Normal appearance. She is well-developed.  HENT:     Head: Normocephalic and atraumatic.  Neck:     Vascular: No carotid bruit.  Cardiovascular:     Rate and Rhythm: Normal rate and regular rhythm.     Pulses: Normal pulses.     Heart sounds: No murmur heard. Pulmonary:     Effort: Pulmonary effort is normal. No respiratory distress.     Breath sounds: No wheezing or rhonchi.  Musculoskeletal:     Cervical back: Normal range of motion.     Right  lower leg: No edema.     Left lower leg: No edema.  Lymphadenopathy:     Cervical: No cervical adenopathy.  Skin:    General: Skin is warm and dry.     Findings: No rash.  Neurological:     Mental Status: She is alert and oriented to person, place, and time.  Psychiatric:        Mood and Affect: Mood normal.        Behavior: Behavior  normal.     Wt Readings from Last 3 Encounters:  03/21/23 186 lb (84.4 kg)  01/30/23 184 lb (83.5 kg)  09/10/22 184 lb 9.6 oz (83.7 kg)    BP 112/70   Pulse 65   Ht 5\' 8"  (1.727 m)   Wt 186 lb (84.4 kg)   SpO2 97%   BMI 28.28 kg/m   Assessment and Plan:  Problem List Items Addressed This Visit       Unprioritized   Hypercholesterolemia - Primary (Chronic)    LDL is  Lab Results  Component Value Date   LDLCALC 92 09/10/2022   Currently being treated with atorvastatin with good compliance and no concerns.       Relevant Medications   hydrochlorothiazide (HYDRODIURIL) 50 MG tablet   Other Relevant Orders   Lipid panel   Nephrolithiasis   Relevant Medications   hydrochlorothiazide (HYDRODIURIL) 50 MG tablet   Prediabetes (Chronic)    Not currently working much with diet. 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 Lab Results  Component Value Date   HGBA1C 5.8 (H) 09/10/2022         Relevant Orders   Comprehensive metabolic panel   Hemoglobin A1c   Primary insomnia    Recommend trial of Melatonin or Simply Sleep       Return in about 6 months (around 09/19/2023) for CPX.    Reubin Milan, MD [facility] [place]

## 2023-03-21 NOTE — Assessment & Plan Note (Signed)
LDL is  Lab Results  Component Value Date   LDLCALC 92 09/10/2022   Currently being treated with atorvastatin with good compliance and no concerns.

## 2023-03-21 NOTE — Assessment & Plan Note (Signed)
Recommend trial of Melatonin or Simply Sleep

## 2023-03-22 LAB — COMPREHENSIVE METABOLIC PANEL
ALT: 19 [IU]/L (ref 0–32)
AST: 15 [IU]/L (ref 0–40)
Albumin: 4.4 g/dL (ref 3.9–4.9)
Alkaline Phosphatase: 83 [IU]/L (ref 44–121)
BUN/Creatinine Ratio: 19 (ref 12–28)
BUN: 13 mg/dL (ref 8–27)
Bilirubin Total: 0.8 mg/dL (ref 0.0–1.2)
CO2: 26 mmol/L (ref 20–29)
Calcium: 10.1 mg/dL (ref 8.7–10.3)
Chloride: 101 mmol/L (ref 96–106)
Creatinine, Ser: 0.7 mg/dL (ref 0.57–1.00)
Globulin, Total: 2.5 g/dL (ref 1.5–4.5)
Glucose: 94 mg/dL (ref 70–99)
Potassium: 3.8 mmol/L (ref 3.5–5.2)
Sodium: 142 mmol/L (ref 134–144)
Total Protein: 6.9 g/dL (ref 6.0–8.5)
eGFR: 94 mL/min/{1.73_m2} (ref >59)

## 2023-03-22 LAB — LIPID PANEL
Chol/HDL Ratio: 3.3 {ratio} (ref 0.0–4.4)
Cholesterol, Total: 180 mg/dL (ref 100–199)
HDL: 54 mg/dL (ref >39)
LDL Chol Calc (NIH): 96 mg/dL (ref 0–99)
Triglycerides: 176 mg/dL — ABNORMAL HIGH (ref 0–149)
VLDL Cholesterol Cal: 30 mg/dL (ref 5–40)

## 2023-03-22 LAB — HEMOGLOBIN A1C
Est. average glucose Bld gHb Est-mCnc: 126 mg/dL
Hgb A1c MFr Bld: 6 % — ABNORMAL HIGH (ref 4.8–5.6)

## 2023-05-07 DIAGNOSIS — H25043 Posterior subcapsular polar age-related cataract, bilateral: Secondary | ICD-10-CM | POA: Diagnosis not present

## 2023-06-19 ENCOUNTER — Encounter: Payer: Self-pay | Admitting: Internal Medicine

## 2023-06-25 ENCOUNTER — Encounter: Payer: Self-pay | Admitting: Internal Medicine

## 2023-07-12 DIAGNOSIS — H43813 Vitreous degeneration, bilateral: Secondary | ICD-10-CM | POA: Diagnosis not present

## 2023-07-12 DIAGNOSIS — Z0100 Encounter for examination of eyes and vision without abnormal findings: Secondary | ICD-10-CM | POA: Diagnosis not present

## 2023-07-12 DIAGNOSIS — H2513 Age-related nuclear cataract, bilateral: Secondary | ICD-10-CM | POA: Diagnosis not present

## 2023-07-12 DIAGNOSIS — H04123 Dry eye syndrome of bilateral lacrimal glands: Secondary | ICD-10-CM | POA: Diagnosis not present

## 2023-08-06 ENCOUNTER — Encounter: Payer: Self-pay | Admitting: Ophthalmology

## 2023-08-12 NOTE — Discharge Instructions (Signed)

## 2023-08-14 ENCOUNTER — Other Ambulatory Visit: Payer: Self-pay

## 2023-08-14 ENCOUNTER — Ambulatory Visit: Payer: Self-pay | Admitting: Anesthesiology

## 2023-08-14 ENCOUNTER — Ambulatory Visit
Admission: RE | Admit: 2023-08-14 | Discharge: 2023-08-14 | Disposition: A | Discharge status: Home / Self Care | Payer: Medicare PPO | Attending: Ophthalmology | Admitting: Ophthalmology

## 2023-08-14 ENCOUNTER — Encounter
Admission: RE | Disposition: A | Discharge status: Home / Self Care | Payer: Self-pay | Source: Home / Self Care | Attending: Ophthalmology

## 2023-08-14 ENCOUNTER — Encounter: Payer: Self-pay | Admitting: Ophthalmology

## 2023-08-14 DIAGNOSIS — H2512 Age-related nuclear cataract, left eye: Secondary | ICD-10-CM | POA: Insufficient documentation

## 2023-08-14 HISTORY — DX: Other complications of anesthesia, initial encounter: T88.59XA

## 2023-08-14 HISTORY — DX: Other specified postprocedural states: R11.2

## 2023-08-14 HISTORY — DX: Other specified postprocedural states: Z98.890

## 2023-08-14 HISTORY — DX: Prediabetes: R73.03

## 2023-08-14 HISTORY — DX: Family history of other specified conditions: Z84.89

## 2023-08-14 HISTORY — PX: CATARACT EXTRACTION W/PHACO: SHX586

## 2023-08-14 SURGERY — PHACOEMULSIFICATION, CATARACT, WITH IOL INSERTION
Anesthesia: Monitor Anesthesia Care | Site: Eye | Laterality: Left

## 2023-08-14 MED ORDER — LIDOCAINE HCL (PF) 2 % IJ SOLN
INTRAOCULAR | Status: DC | PRN
  Administered 2023-08-14: 1 mL via INTRAMUSCULAR

## 2023-08-14 MED ORDER — BRIMONIDINE TARTRATE-TIMOLOL 0.2-0.5 % OP SOLN
OPHTHALMIC | Status: DC | PRN
  Administered 2023-08-14: 1 [drp] via OPHTHALMIC

## 2023-08-14 MED ORDER — MIDAZOLAM HCL 2 MG/2ML IJ SOLN
INTRAMUSCULAR | Status: DC | PRN
  Administered 2023-08-14 (×2): 1 mg via INTRAVENOUS

## 2023-08-14 MED ORDER — SIGHTPATH DOSE#1 BSS IO SOLN
INTRAOCULAR | Status: DC | PRN
  Administered 2023-08-14: 56 mL via OPHTHALMIC

## 2023-08-14 MED ORDER — FENTANYL CITRATE (PF) 100 MCG/2ML IJ SOLN
INTRAMUSCULAR | Status: DC | PRN
  Administered 2023-08-14 (×2): 50 ug via INTRAVENOUS

## 2023-08-14 MED ORDER — ARMC OPHTHALMIC DILATING DROPS
1.0000 | OPHTHALMIC | Status: DC | PRN
  Administered 2023-08-14 (×3): 1 via OPHTHALMIC

## 2023-08-14 MED ORDER — TETRACAINE HCL 0.5 % OP SOLN
1.0000 [drp] | OPHTHALMIC | Status: DC | PRN
  Administered 2023-08-14 (×3): 1 [drp] via OPHTHALMIC

## 2023-08-14 MED ORDER — ARMC OPHTHALMIC DILATING DROPS
OPHTHALMIC | Status: AC
  Filled 2023-08-14: qty 0.5

## 2023-08-14 MED ORDER — FENTANYL CITRATE (PF) 100 MCG/2ML IJ SOLN
INTRAMUSCULAR | Status: AC
  Filled 2023-08-14: qty 2

## 2023-08-14 MED ORDER — ONDANSETRON HCL 4 MG/2ML IJ SOLN: 4.0000 mg | Freq: Once | INTRAMUSCULAR | Status: DC

## 2023-08-14 MED ORDER — CEFUROXIME OPHTHALMIC INJECTION 1 MG/0.1 ML
INJECTION | OPHTHALMIC | Status: DC | PRN
  Administered 2023-08-14: .1 mL via OPHTHALMIC

## 2023-08-14 MED ORDER — SIGHTPATH DOSE#1 NA HYALUR & NA CHOND-NA HYALUR IO KIT
PACK | INTRAOCULAR | Status: DC | PRN
  Administered 2023-08-14: 1 via OPHTHALMIC

## 2023-08-14 MED ORDER — SIGHTPATH DOSE#1 BSS IO SOLN
INTRAOCULAR | Status: DC | PRN
  Administered 2023-08-14: 15 mL

## 2023-08-14 MED ORDER — MIDAZOLAM HCL 2 MG/2ML IJ SOLN
INTRAMUSCULAR | Status: AC
Start: 2023-08-14 — End: ?
  Filled 2023-08-14: qty 2

## 2023-08-14 MED ORDER — TETRACAINE HCL 0.5 % OP SOLN
OPHTHALMIC | Status: AC
  Filled 2023-08-14: qty 4

## 2023-08-14 SURGICAL SUPPLY — 10 items
CATARACT SUITE SIGHTPATH (MISCELLANEOUS) ×1 IMPLANT
FEE CATARACT SUITE SIGHTPATH (MISCELLANEOUS) ×1 IMPLANT
GLOVE BIOGEL PI IND STRL 8 (GLOVE) ×1 IMPLANT
GLOVE SURG LX STRL 7.5 STRW (GLOVE) ×1 IMPLANT
GLOVE SURG PROTEXIS BL SZ6.5 (GLOVE) ×1 IMPLANT
GLOVE SURG SYN 6.5 PF PI BL (GLOVE) ×1 IMPLANT
LENS IOL TECNIS EYHANCE 22.0 (Intraocular Lens) IMPLANT
NDL FILTER BLUNT 18X1 1/2 (NEEDLE) ×1 IMPLANT
NEEDLE FILTER BLUNT 18X1 1/2 (NEEDLE) ×1 IMPLANT
SYR 3ML LL SCALE MARK (SYRINGE) ×1 IMPLANT

## 2023-08-14 NOTE — H&P (Signed)
 [facility]   Primary Care Physician:  Reubin Milan, MD Ophthalmologist: Dr. Lockie Mola  Pre-Procedure History & Physical: HPI:  Gabriela Mullins is a 68 y.o. female here for ophthalmic surgery.   Past Medical History:  Diagnosis Date   Complication of anesthesia    BP dropped after hip replacement and gall bladder surgery   Family history of adverse reaction to anesthesia    Mother - slow to wake   Hyperlipidemia    Nephrolithiasis    Osteopenia    PONV (postoperative nausea and vomiting)    Pre-diabetes     Past Surgical History:  Procedure Laterality Date   CHOLECYSTECTOMY  1990   EXTRACORPOREAL SHOCK WAVE LITHOTRIPSY     TONSILLECTOMY AND ADENOIDECTOMY     TOTAL HIP ARTHROPLASTY Left 11/15/2022   URETEROLITHOTOMY  1970   WISDOM TOOTH EXTRACTION Bilateral     Prior to Admission medications   Medication Sig Start Date End Date Taking? Authorizing Provider  atorvastatin (LIPITOR) 10 MG tablet Take 1 tablet (10 mg total) by mouth daily. 09/10/22  Yes Reubin Milan, MD  Calcium Citrate-Vitamin D (CALCIUM CITRATE + D3) 250-5 MG-MCG TABS Take by mouth.   Yes [provider]  hydrochlorothiazide (HYDRODIURIL) 50 MG tablet Take 1 tablet (50 mg total) by mouth daily. 03/21/23  Yes Reubin Milan, MD  potassium citrate (UROCIT-K) 10 MEQ (1080 MG) SR tablet Take 1 tablet (10 mEq total) by mouth 2 (two) times daily with a meal. 09/10/22  Yes Reubin Milan, MD    Allergies as of 07/15/2023 - Review Complete 03/21/2023  Allergen Reaction Noted   Clindamycin Hives 09/30/2013   Levofloxacin Hives 09/30/2013   Penicillins Hives 03/31/2013   Tetracycline Hives 09/30/2013    Family History  Problem Relation Age of Onset   Arthritis Mother    Cancer Mother    Dementia Mother    Heart attack Father    Heart murmur Father    Hernia Brother    Factor V Leiden deficiency Daughter    Clotting disorder Daughter    Heart attack Paternal  Grandfather    Breast cancer Neg Hx     Social History   Socioeconomic History   Marital status: Married    Spouse name: Gabriela Mullins   Number of children: 2   Years of education: 16   Highest education level: Bachelor's degree (e.g., BA, AB, BS)  Occupational History   Occupation: Retired  Tobacco Use   Smoking status: Never   Smokeless tobacco: Never  Vaping Use   Vaping status: Never Used  Substance and Sexual Activity   Alcohol use: Not Currently   Drug use: Never   Sexual activity: Yes    Partners: Male  Other Topics Concern   Not on file  Social History Narrative   Not on file   Social Drivers of Health   Financial Resource Strain: Low Risk  (01/30/2023)   Overall Financial Resource Strain (CARDIA)    Difficulty of Paying Living Expenses: Not hard at all  Food Insecurity: No Food Insecurity (01/30/2023)   Hunger Vital Sign    Worried About Running Out of Food in the Last Year: Never true    Ran Out of Food in the Last Year: Never true  Transportation Needs: No Transportation Needs (01/30/2023)   PRAPARE - Administrator, Civil Service (Medical): No    Lack of Transportation (Non-Medical): No  Physical Activity: Insufficiently Active (01/30/2023)   Exercise  Vital Sign    Days of Exercise per Week: 4 days    Minutes of Exercise per Session: 30 min  Stress: No Stress Concern Present (01/30/2023)   [employer] of Occupational Health - Occupational Stress Questionnaire    Feeling of Stress : Not at all  Social Connections: Socially Integrated (01/30/2023)   Social Connection and Isolation Panel [NHANES]    Frequency of Communication with Friends and Family: Three times a week    Frequency of Social Gatherings with Friends and Family: Twice a week    Attends Religious Services: More than 4 times per year    Active Member of [employer] or Organizations: Yes    Attends Engineer, structural: More than 4 times per year    Marital Status: Married   Catering manager Violence: Not At Risk (01/30/2023)   Humiliation, Afraid, Rape, and Kick questionnaire    Fear of Current or Ex-Partner: No    Emotionally Abused: No    Physically Abused: No    Sexually Abused: No    Review of Systems: See HPI, otherwise negative ROS  Physical Exam: BP (!) 146/93   Pulse 88   Temp 98.1 F (36.7 C) (Temporal)   Resp 15   Ht 5\' 8"  (1.727 m)   Wt 87.1 kg   SpO2 95%   BMI 29.19 kg/m  General:   Alert,  pleasant and cooperative in NAD Head:  Normocephalic and atraumatic. Lungs:  Clear to auscultation.    Heart:  Regular rate and rhythm.   Impression/Plan: Gabriela Mullins is here for ophthalmic surgery.  Risks, benefits, limitations, and alternatives regarding ophthalmic surgery have been reviewed with the patient.  Questions have been answered.  All parties agreeable.   Lockie Mola, MD  08/14/2023, 11:22 AM

## 2023-08-14 NOTE — Anesthesia Preprocedure Evaluation (Addendum)
 Anesthesia Evaluation  Patient identified by MRN, date of birth, ID band Patient awake    Reviewed: Allergy & Precautions, H&P , NPO status , Patient's Chart, lab work & pertinent test results  History of Anesthesia Complications (+) PONV, Family history of anesthesia reaction and history of anesthetic complications  Airway Mallampati: III  TM Distance: <3 FB Neck ROM: Full    Dental no notable dental hx.    Pulmonary neg pulmonary ROS   Pulmonary exam normal breath sounds clear to auscultation       Cardiovascular negative cardio ROS Normal cardiovascular exam Rhythm:Regular Rate:Normal     Neuro/Psych negative neurological ROS  negative psych ROS   GI/Hepatic negative GI ROS, Neg liver ROS,,,  Endo/Other  negative endocrine ROS    Renal/GU Renal diseasenegative Renal ROS  negative genitourinary   Musculoskeletal negative musculoskeletal ROS (+)    Abdominal   Peds negative pediatric ROS (+)  Hematology negative hematology ROS (+)   Anesthesia Other Findings Hyperlipidemia  Osteopenia Nephrolithiasis   Complication of anesthesia PONV (postoperative nausea and vomiting)  Family history of adverse reaction to anesthesia--mother slow to awaken Pre-diabetes     Reproductive/Obstetrics negative OB ROS                              Anesthesia Physical Anesthesia Plan  ASA: 2  Anesthesia Plan: MAC   Post-op Pain Management:    Induction: Intravenous  PONV Risk Score and Plan:   Airway Management Planned: Natural Airway and Nasal Cannula  Additional Equipment:   Intra-op Plan:   Post-operative Plan:   Informed Consent: I have reviewed the patients History and Physical, chart, labs and discussed the procedure including the risks, benefits and alternatives for the proposed anesthesia with the patient or authorized representative who has indicated his/her understanding and  acceptance.     Dental Advisory Given  Plan Discussed with: Anesthesiologist, CRNA and Surgeon  Anesthesia Plan Comments: (Patient consented for risks of anesthesia including but not limited to:  - adverse reactions to medications - damage to eyes, teeth, lips or other oral mucosa - nerve damage due to positioning  - sore throat or hoarseness - Damage to heart, brain, nerves, lungs, other parts of body or loss of life  Patient voiced understanding and assent.)         Anesthesia Quick Evaluation

## 2023-08-14 NOTE — Op Note (Signed)
 OPERATIVE NOTE  Gabriela Mullins [id number] 08/14/2023   PREOPERATIVE DIAGNOSIS:  Nuclear sclerotic cataract left eye. H25.12   POSTOPERATIVE DIAGNOSIS:    Nuclear sclerotic cataract left eye.     PROCEDURE:  Phacoemusification with posterior chamber intraocular lens placement of the left eye  Ultrasound time: Procedure(s): CATARACT EXTRACTION PHACO AND INTRAOCULAR LENS PLACEMENT (IOC) LEFT  5.60  00:33.4 (Left)  LENS:   Implant Name Type Inv. Item Serial No. Manufacturer Lot No. LRB No. Used Action  LENS IOL TECNIS EYHANCE 22.0 - [id number] Intraocular Lens LENS IOL TECNIS EYHANCE 22.0 [id number] [employer]  Left 1 Implanted      SURGEON:  Deirdre Evener, MD   ANESTHESIA:  Topical with tetracaine drops and 2% Xylocaine jelly, augmented with 1% preservative-free intracameral lidocaine.    COMPLICATIONS:  None.   DESCRIPTION OF PROCEDURE:  The patient was identified in the holding room and transported to the operating room and placed in the supine position under the operating microscope.  The left eye was identified as the operative eye and it was prepped and draped in the usual sterile ophthalmic fashion.   A 1 millimeter clear-corneal paracentesis was made at the 1:30 position.  0.5 ml of preservative-free 1% lidocaine was injected into the anterior chamber.  The anterior chamber was filled with Viscoat viscoelastic.  A 2.4 millimeter keratome was used to make a near-clear corneal incision at the 10:30 position.  .  A curvilinear capsulorrhexis was made with a cystotome and capsulorrhexis forceps.  Balanced salt solution was used to hydrodissect and hydrodelineate the nucleus.   Phacoemulsification was then used in stop and chop fashion to remove the lens nucleus and epinucleus.  The remaining cortex was then removed using the irrigation and aspiration handpiece. Provisc was then placed into the capsular bag to distend it for lens placement.  A lens was then injected into the  capsular bag.  The remaining viscoelastic was aspirated.   Wounds were hydrated with balanced salt solution.  The anterior chamber was inflated to a physiologic pressure with balanced salt solution.  No wound leaks were noted. Cefuroxime 0.1 ml of a 10mg /ml solution was injected into the anterior chamber for a dose of 1 mg of intracameral antibiotic at the completion of the case.   Timolol and Brimonidine drops were applied to the eye.  The patient was taken to the recovery room in stable condition without complications of anesthesia or surgery.  [name] 08/14/2023, 12:32 PM

## 2023-08-14 NOTE — Transfer of Care (Signed)
 Immediate Anesthesia Transfer of Care Note  Patient: Gabriela Mullins  Procedure(s) Performed: CATARACT EXTRACTION PHACO AND INTRAOCULAR LENS PLACEMENT (IOC) LEFT  5.60  00:33.4 (Left: Eye)  Patient Location: PACU  Anesthesia Type: MAC  Level of Consciousness: awake, alert  and patient cooperative  Airway and Oxygen Therapy: Patient Spontanous Breathing and Patient connected to supplemental oxygen  Post-op Assessment: Post-op Vital signs reviewed, Patient's Cardiovascular Status Stable, Respiratory Function Stable, Patent Airway and No signs of Nausea or vomiting  Post-op Vital Signs: Reviewed and stable  Complications: No notable events documented.

## 2023-08-14 NOTE — Anesthesia Postprocedure Evaluation (Signed)
 Anesthesia Post Note  Patient: Gabriela Mullins  Procedure(s) Performed: CATARACT EXTRACTION PHACO AND INTRAOCULAR LENS PLACEMENT (IOC) LEFT  5.60  00:33.4 (Left: Eye)  Patient location during evaluation: PACU Anesthesia Type: MAC Level of consciousness: awake and alert Pain management: pain level controlled Vital Signs Assessment: post-procedure vital signs reviewed and stable Respiratory status: spontaneous breathing, nonlabored ventilation, respiratory function stable and patient connected to nasal cannula oxygen Cardiovascular status: stable and blood pressure returned to baseline Postop Assessment: no apparent nausea or vomiting Anesthetic complications: no   No notable events documented.   Last Vitals:  Vitals:   08/14/23 1238 08/14/23 1239  BP: 128/83   Pulse: 81 83  Resp: 15 20  Temp:    SpO2: 92% 90%    Last Pain:  Vitals:   08/14/23 1238  TempSrc:   PainSc: 0-No pain                 Thoams Siefert C Lilyan Prete

## 2023-08-15 ENCOUNTER — Encounter: Payer: Self-pay | Admitting: Ophthalmology

## 2023-08-19 NOTE — Anesthesia Preprocedure Evaluation (Addendum)
 Anesthesia Evaluation  Patient identified by MRN, date of birth, ID band Patient awake    Reviewed: Allergy & Precautions, H&P , NPO status , Patient's Chart, lab work & pertinent test results  History of Anesthesia Complications (+) PONV, Family history of anesthesia reaction and history of anesthetic complications  Airway Mallampati: III  TM Distance: <3 FB Neck ROM: Full    Dental no notable dental hx.    Pulmonary neg pulmonary ROS   Pulmonary exam normal breath sounds clear to auscultation       Cardiovascular negative cardio ROS Normal cardiovascular exam Rhythm:Regular Rate:Normal     Neuro/Psych negative neurological ROS  negative psych ROS   GI/Hepatic negative GI ROS, Neg liver ROS,,,  Endo/Other  negative endocrine ROS    Renal/GU Renal diseasenegative Renal ROS  negative genitourinary   Musculoskeletal negative musculoskeletal ROS (+)    Abdominal   Peds negative pediatric ROS (+)  Hematology negative hematology ROS (+)   Anesthesia Other Findings Previous cataract surgery 08-14-23  Hyperlipidemia  Osteopenia Nephrolithiasis  Complication of anesthesia , BP dropped after hip surgery PONV (postoperative nausea and vomiting)  Family history of adverse reaction to anesthesia mother slow to awaken Pre-diabetes     Reproductive/Obstetrics negative OB ROS                             Anesthesia Physical Anesthesia Plan  ASA: 2  Anesthesia Plan: MAC   Post-op Pain Management:    Induction: Intravenous  PONV Risk Score and Plan:   Airway Management Planned: Natural Airway and Nasal Cannula  Additional Equipment:   Intra-op Plan:   Post-operative Plan:   Informed Consent: I have reviewed the patients History and Physical, chart, labs and discussed the procedure including the risks, benefits and alternatives for the proposed anesthesia with the patient or  authorized representative who has indicated his/her understanding and acceptance.     Dental Advisory Given  Plan Discussed with: Anesthesiologist, CRNA and Surgeon  Anesthesia Plan Comments: (Patient consented for risks of anesthesia including but not limited to:  - adverse reactions to medications - damage to eyes, teeth, lips or other oral mucosa - nerve damage due to positioning  - sore throat or hoarseness - Damage to heart, brain, nerves, lungs, other parts of body or loss of life  Patient voiced understanding and assent.)        Anesthesia Quick Evaluation

## 2023-08-26 NOTE — Discharge Instructions (Signed)

## 2023-08-28 ENCOUNTER — Other Ambulatory Visit: Payer: Self-pay

## 2023-08-28 ENCOUNTER — Encounter
Admission: RE | Disposition: A | Discharge status: Home / Self Care | Payer: Self-pay | Source: Home / Self Care | Attending: Ophthalmology

## 2023-08-28 ENCOUNTER — Ambulatory Visit: Payer: Self-pay | Admitting: Anesthesiology

## 2023-08-28 ENCOUNTER — Encounter: Payer: Self-pay | Admitting: Ophthalmology

## 2023-08-28 ENCOUNTER — Ambulatory Visit
Admission: RE | Admit: 2023-08-28 | Discharge: 2023-08-28 | Disposition: A | Discharge status: Home / Self Care | Payer: Medicare PPO | Attending: Ophthalmology | Admitting: Ophthalmology

## 2023-08-28 DIAGNOSIS — Z961 Presence of intraocular lens: Secondary | ICD-10-CM | POA: Insufficient documentation

## 2023-08-28 DIAGNOSIS — H2511 Age-related nuclear cataract, right eye: Secondary | ICD-10-CM | POA: Insufficient documentation

## 2023-08-28 HISTORY — PX: CATARACT EXTRACTION W/PHACO: SHX586

## 2023-08-28 SURGERY — PHACOEMULSIFICATION, CATARACT, WITH IOL INSERTION
Anesthesia: Monitor Anesthesia Care | Site: Eye | Laterality: Right

## 2023-08-28 MED ORDER — TETRACAINE HCL 0.5 % OP SOLN
OPHTHALMIC | Status: AC
  Filled 2023-08-28: qty 4

## 2023-08-28 MED ORDER — FENTANYL CITRATE (PF) 100 MCG/2ML IJ SOLN
INTRAMUSCULAR | Status: AC
  Filled 2023-08-28: qty 2

## 2023-08-28 MED ORDER — SIGHTPATH DOSE#1 NA HYALUR & NA CHOND-NA HYALUR IO KIT
PACK | INTRAOCULAR | Status: DC | PRN
  Administered 2023-08-28: 1 via OPHTHALMIC

## 2023-08-28 MED ORDER — LIDOCAINE HCL (PF) 2 % IJ SOLN
INTRAOCULAR | Status: DC | PRN
  Administered 2023-08-28: 2 mL

## 2023-08-28 MED ORDER — MIDAZOLAM HCL 2 MG/2ML IJ SOLN
INTRAMUSCULAR | Status: AC
  Filled 2023-08-28: qty 2

## 2023-08-28 MED ORDER — TETRACAINE HCL 0.5 % OP SOLN
1.0000 [drp] | OPHTHALMIC | Status: DC | PRN
  Administered 2023-08-28 (×3): 1 [drp] via OPHTHALMIC

## 2023-08-28 MED ORDER — SIGHTPATH DOSE#1 BSS IO SOLN
INTRAOCULAR | Status: DC | PRN
  Administered 2023-08-28: 75 mL via OPHTHALMIC

## 2023-08-28 MED ORDER — ARMC OPHTHALMIC DILATING DROPS
OPHTHALMIC | Status: AC
  Filled 2023-08-28: qty 0.5

## 2023-08-28 MED ORDER — MIDAZOLAM HCL 2 MG/2ML IJ SOLN
INTRAMUSCULAR | Status: DC | PRN
Start: 2023-08-28 — End: 2023-08-28
  Administered 2023-08-28 (×2): 1 mg via INTRAVENOUS

## 2023-08-28 MED ORDER — ONDANSETRON HCL 4 MG/2ML IJ SOLN
INTRAMUSCULAR | Status: DC | PRN
  Administered 2023-08-28: 4 mg via INTRAVENOUS

## 2023-08-28 MED ORDER — BRIMONIDINE TARTRATE-TIMOLOL 0.2-0.5 % OP SOLN
OPHTHALMIC | Status: DC | PRN
  Administered 2023-08-28: 1 [drp] via OPHTHALMIC

## 2023-08-28 MED ORDER — ARMC OPHTHALMIC DILATING DROPS
1.0000 | OPHTHALMIC | Status: DC | PRN
  Administered 2023-08-28 (×3): 1 via OPHTHALMIC

## 2023-08-28 MED ORDER — CEFUROXIME OPHTHALMIC INJECTION 1 MG/0.1 ML
INJECTION | OPHTHALMIC | Status: DC | PRN
  Administered 2023-08-28: 1 mg via INTRACAMERAL

## 2023-08-28 MED ORDER — FENTANYL CITRATE (PF) 100 MCG/2ML IJ SOLN
INTRAMUSCULAR | Status: DC | PRN
Start: 2023-08-28 — End: 2023-08-28
  Administered 2023-08-28 (×2): 50 ug via INTRAVENOUS

## 2023-08-28 MED ORDER — ONDANSETRON HCL 4 MG/2ML IJ SOLN
INTRAMUSCULAR | Status: AC
  Filled 2023-08-28: qty 2

## 2023-08-28 MED ORDER — SIGHTPATH DOSE#1 BSS IO SOLN
INTRAOCULAR | Status: DC | PRN
  Administered 2023-08-28: 15 mL via INTRAOCULAR

## 2023-08-28 SURGICAL SUPPLY — 10 items
CATARACT SUITE SIGHTPATH (MISCELLANEOUS) ×1 IMPLANT
FEE CATARACT SUITE SIGHTPATH (MISCELLANEOUS) ×1 IMPLANT
GLOVE BIOGEL PI IND STRL 8 (GLOVE) ×1 IMPLANT
GLOVE SURG LX STRL 7.5 STRW (GLOVE) ×1 IMPLANT
GLOVE SURG PROTEXIS BL SZ6.5 (GLOVE) ×1 IMPLANT
GLOVE SURG SYN 6.5 PF PI BL (GLOVE) ×1 IMPLANT
LENS IOL TECNIS EYHANCE 22.5 (Intraocular Lens) IMPLANT
NDL FILTER BLUNT 18X1 1/2 (NEEDLE) ×1 IMPLANT
NEEDLE FILTER BLUNT 18X1 1/2 (NEEDLE) ×1 IMPLANT
SYR 3ML LL SCALE MARK (SYRINGE) ×1 IMPLANT

## 2023-08-28 NOTE — Op Note (Signed)
 LOCATION:  [facility]   PREOPERATIVE DIAGNOSIS:    Nuclear sclerotic cataract right eye. H25.11   POSTOPERATIVE DIAGNOSIS:  Nuclear sclerotic cataract right eye.     PROCEDURE:  Phacoemusification with posterior chamber intraocular lens placement of the right eye   ULTRASOUND TIME: Procedure(s): CATARACT EXTRACTION PHACO AND INTRAOCULAR LENS PLACEMENT (IOC) RIGHT 6.47 00:42.9 (Right)  LENS:   Implant Name Type Inv. Item Serial No. Manufacturer Lot No. LRB No. Used Action  LENS IOL TECNIS EYHANCE 22.5 - [id number] Intraocular Lens LENS IOL TECNIS EYHANCE 22.5 [id number] [employer]  Right 1 Implanted         SURGEON:  Deirdre Evener, MD   ANESTHESIA:  Topical with tetracaine drops and 2% Xylocaine jelly, augmented with 1% preservative-free intracameral lidocaine.    COMPLICATIONS:  None.   DESCRIPTION OF PROCEDURE:  The patient was identified in the holding room and transported to the operating room and placed in the supine position under the operating microscope.  The right eye was identified as the operative eye and it was prepped and draped in the usual sterile ophthalmic fashion.   A 1 millimeter clear-corneal paracentesis was made at the 12:00 position.  0.5 ml of preservative-free 1% lidocaine was injected into the anterior chamber. The anterior chamber was filled with Viscoat viscoelastic.  A 2.4 millimeter keratome was used to make a near-clear corneal incision at the 9:00 position.  A curvilinear capsulorrhexis was made with a cystotome and capsulorrhexis forceps.  Balanced salt solution was used to hydrodissect and hydrodelineate the nucleus.   Phacoemulsification was then used in stop and chop fashion to remove the lens nucleus and epinucleus.  The remaining cortex was then removed using the irrigation and aspiration handpiece. Provisc was then placed into the capsular bag to distend it for lens placement.  A lens was then injected into the capsular bag.  The  remaining viscoelastic was aspirated.   Wounds were hydrated with balanced salt solution.  The anterior chamber was inflated to a physiologic pressure with balanced salt solution.  No wound leaks were noted. Cefuroxime 0.1 ml of a 10mg /ml solution was injected into the anterior chamber for a dose of 1 mg of intracameral antibiotic at the completion of the case.   Timolol and Brimonidine drops were applied to the eye.  The patient was taken to the recovery room in stable condition without complications of anesthesia or surgery.   Miguel Medal 08/28/2023, 12:28 PM

## 2023-08-28 NOTE — H&P (Signed)
 [facility]   Primary Care Physician:  Reubin Milan, MD Ophthalmologist: Dr. Lockie Mola  Pre-Procedure History & Physical: HPI:  Gabriela Mullins is a 67 y.o. female here for ophthalmic surgery.   Past Medical History:  Diagnosis Date   Complication of anesthesia    BP dropped after hip replacement and gall bladder surgery   Family history of adverse reaction to anesthesia    Mother - slow to wake   Hyperlipidemia    Nephrolithiasis    Osteopenia    PONV (postoperative nausea and vomiting)    Pre-diabetes     Past Surgical History:  Procedure Laterality Date   CATARACT EXTRACTION W/PHACO Left 08/14/2023   Procedure: CATARACT EXTRACTION PHACO AND INTRAOCULAR LENS PLACEMENT (IOC) LEFT  5.60  00:33.4;  Surgeon: Lockie Mola, MD;  Location: [facility];  Service: Ophthalmology;  Laterality: Left;   CHOLECYSTECTOMY  1990   EXTRACORPOREAL SHOCK WAVE LITHOTRIPSY     TONSILLECTOMY AND ADENOIDECTOMY     TOTAL HIP ARTHROPLASTY Left 11/15/2022   URETEROLITHOTOMY  1970   WISDOM TOOTH EXTRACTION Bilateral     Prior to Admission medications   Medication Sig Start Date End Date Taking? Authorizing Provider  atorvastatin (LIPITOR) 10 MG tablet Take 1 tablet (10 mg total) by mouth daily. 09/10/22  Yes Reubin Milan, MD  Calcium Citrate-Vitamin D (CALCIUM CITRATE + D3) 250-5 MG-MCG TABS Take by mouth.   Yes [provider]  hydrochlorothiazide (HYDRODIURIL) 50 MG tablet Take 1 tablet (50 mg total) by mouth daily. 03/21/23  Yes Reubin Milan, MD  potassium citrate (UROCIT-K) 10 MEQ (1080 MG) SR tablet Take 1 tablet (10 mEq total) by mouth 2 (two) times daily with a meal. 09/10/22  Yes Reubin Milan, MD    Allergies as of 07/15/2023 - Review Complete 03/21/2023  Allergen Reaction Noted   Clindamycin Hives 09/30/2013   Levofloxacin Hives 09/30/2013   Penicillins Hives 03/31/2013   Tetracycline Hives 09/30/2013    Family History   Problem Relation Age of Onset   Arthritis Mother    Cancer Mother    Dementia Mother    Heart attack Father    Heart murmur Father    Hernia Brother    Factor V Leiden deficiency Daughter    Clotting disorder Daughter    Heart attack Paternal Grandfather    Breast cancer Neg Hx     Social History   Socioeconomic History   Marital status: Married    Spouse name: Joette Schmoker   Number of children: 2   Years of education: 16   Highest education level: Bachelor's degree (e.g., BA, AB, BS)  Occupational History   Occupation: Retired  Tobacco Use   Smoking status: Never   Smokeless tobacco: Never  Vaping Use   Vaping status: Never Used  Substance and Sexual Activity   Alcohol use: Not Currently   Drug use: Never   Sexual activity: Yes    Partners: Male  Other Topics Concern   Not on file  Social History Narrative   Not on file   Social Drivers of Health   Financial Resource Strain: Low Risk  (01/30/2023)   Overall Financial Resource Strain (CARDIA)    Difficulty of Paying Living Expenses: Not hard at all  Food Insecurity: No Food Insecurity (01/30/2023)   Hunger Vital Sign    Worried About Running Out of Food in the Last Year: Never true    Ran Out of Food in the Last Year:  Never true  Transportation Needs: No Transportation Needs (01/30/2023)   PRAPARE - Administrator, Civil Service (Medical): No    Lack of Transportation (Non-Medical): No  Physical Activity: Insufficiently Active (01/30/2023)   Exercise Vital Sign    Days of Exercise per Week: 4 days    Minutes of Exercise per Session: 30 min  Stress: No Stress Concern Present (01/30/2023)   [employer] of Occupational Health - Occupational Stress Questionnaire    Feeling of Stress : Not at all  Social Connections: Socially Integrated (01/30/2023)   Social Connection and Isolation Panel [NHANES]    Frequency of Communication with Friends and Family: Three times a week    Frequency of Social  Gatherings with Friends and Family: Twice a week    Attends Religious Services: More than 4 times per year    Active Member of [employer] or Organizations: Yes    Attends Engineer, structural: More than 4 times per year    Marital Status: Married  Catering manager Violence: Not At Risk (01/30/2023)   Humiliation, Afraid, Rape, and Kick questionnaire    Fear of Current or Ex-Partner: No    Emotionally Abused: No    Physically Abused: No    Sexually Abused: No    Review of Systems: See HPI, otherwise negative ROS  Physical Exam: There were no vitals taken for this visit. General:   Alert,  pleasant and cooperative in NAD Head:  Normocephalic and atraumatic. Lungs:  Clear to auscultation.    Heart:  Regular rate and rhythm.   Impression/Plan: Gabriela Mullins is here for ophthalmic surgery.  Risks, benefits, limitations, and alternatives regarding ophthalmic surgery have been reviewed with the patient.  Questions have been answered.  All parties agreeable.   Lockie Mola, MD  08/28/2023, 11:20 AM

## 2023-08-28 NOTE — Anesthesia Postprocedure Evaluation (Signed)
 Anesthesia Post Note  Patient: Gabriela Mullins  Procedure(s) Performed: CATARACT EXTRACTION PHACO AND INTRAOCULAR LENS PLACEMENT (IOC) RIGHT 6.47 00:42.9 (Right: Eye)  Patient location during evaluation: PACU Anesthesia Type: MAC Level of consciousness: awake and alert Pain management: pain level controlled Vital Signs Assessment: post-procedure vital signs reviewed and stable Respiratory status: spontaneous breathing, nonlabored ventilation, respiratory function stable and patient connected to nasal cannula oxygen Cardiovascular status: stable and blood pressure returned to baseline Postop Assessment: no apparent nausea or vomiting Anesthetic complications: no   No notable events documented.   Last Vitals:  Vitals:   08/28/23 1230 08/28/23 1234  BP: 133/86 124/76  Pulse: 69 72  Resp: (!) 21 15  Temp:  (!) 36.3 C  SpO2: 95% 95%    Last Pain:  Vitals:   08/28/23 1234  TempSrc:   PainSc: 0-No pain                 Mathilde Mcwherter C Daina Cara

## 2023-08-28 NOTE — Transfer of Care (Signed)
 Immediate Anesthesia Transfer of Care Note  Patient: Gabriela Mullins  Procedure(s) Performed: CATARACT EXTRACTION PHACO AND INTRAOCULAR LENS PLACEMENT (IOC) RIGHT 6.47 00:42.9 (Right: Eye)  Patient Location: PACU  Anesthesia Type: MAC  Level of Consciousness: awake, alert  and patient cooperative  Airway and Oxygen Therapy: Patient Spontanous Breathing and Patient connected to supplemental oxygen  Post-op Assessment: Post-op Vital signs reviewed, Patient's Cardiovascular Status Stable, Respiratory Function Stable, Patent Airway and No signs of Nausea or vomiting  Post-op Vital Signs: Reviewed and stable  Complications: No notable events documented.

## 2023-08-29 ENCOUNTER — Encounter: Payer: Self-pay | Admitting: Ophthalmology

## 2023-09-04 ENCOUNTER — Ambulatory Visit: Payer: Medicare PPO | Admitting: Dermatology

## 2023-09-10 ENCOUNTER — Other Ambulatory Visit: Payer: Self-pay | Admitting: Internal Medicine

## 2023-09-10 DIAGNOSIS — E7800 Pure hypercholesterolemia, unspecified: Secondary | ICD-10-CM

## 2023-09-11 NOTE — Telephone Encounter (Signed)
 Requested Prescriptions  Pending Prescriptions Disp Refills   atorvastatin (LIPITOR) 10 MG tablet [Pharmacy Med Name: Atorvastatin Calcium 10 MG Oral Tablet] 90 tablet 0    Sig: Take 1 tablet by mouth once daily     Cardiovascular:  Antilipid - Statins Failed - 09/11/2023  9:24 AM      Failed - Valid encounter within last 12 months    Recent Outpatient Visits   None     Future Appointments             In 1 week Reubin Milan, MD [facility] at [facility], [facility] [place]   In 4 months Stoioff, Verna Czech, MD [facility]             Failed - Lipid Panel in normal range within the last 12 months    Cholesterol, Total  Date Value Ref Range Status  03/21/2023 180 100 - 199 mg/dL Final   LDL Chol Calc (NIH)  Date Value Ref Range Status  03/21/2023 96 0 - 99 mg/dL Final   HDL  Date Value Ref Range Status  03/21/2023 54 >39 mg/dL Final   Triglycerides  Date Value Ref Range Status  03/21/2023 176 (H) 0 - 149 mg/dL Final         Passed - Patient is not pregnant

## 2023-09-19 ENCOUNTER — Ambulatory Visit (INDEPENDENT_AMBULATORY_CARE_PROVIDER_SITE_OTHER): Payer: Self-pay | Admitting: Internal Medicine

## 2023-09-19 ENCOUNTER — Encounter: Payer: Self-pay | Admitting: Internal Medicine

## 2023-09-19 VITALS — BP 114/74 | HR 97 | Ht 67.99 in | Wt 195.1 lb

## 2023-09-19 DIAGNOSIS — E7800 Pure hypercholesterolemia, unspecified: Secondary | ICD-10-CM | POA: Diagnosis not present

## 2023-09-19 DIAGNOSIS — Z0000 Encounter for general adult medical examination without abnormal findings: Secondary | ICD-10-CM

## 2023-09-19 DIAGNOSIS — N200 Calculus of kidney: Secondary | ICD-10-CM

## 2023-09-19 DIAGNOSIS — R7303 Prediabetes: Secondary | ICD-10-CM

## 2023-09-19 DIAGNOSIS — Z1231 Encounter for screening mammogram for malignant neoplasm of breast: Secondary | ICD-10-CM

## 2023-09-19 DIAGNOSIS — M8580 Other specified disorders of bone density and structure, unspecified site: Secondary | ICD-10-CM

## 2023-09-19 NOTE — Assessment & Plan Note (Signed)
 Managed with diet and exercise. Lab Results  Component Value Date   HGBA1C 6.0 (H) 03/21/2023

## 2023-09-19 NOTE — Progress Notes (Signed)
 Date:  09/19/2023   Name:  Gabriela Mullins   DOB:  July 06, 1955   MRN:  [id number]   Chief Complaint: Annual Exam Gabriela Mullins is a 68 y.o. female who presents today for her Complete Annual Exam. She feels well. She reports exercising walk, 15 minutes a day. She reports she is sleeping well. Breast complaints none.  Health Maintenance  Topic Date Due   COVID-19 Vaccine (3 - Moderna risk series) 10/05/2023*   Mammogram  11/08/2023   Flu Shot  01/03/2024   Medicare Annual Wellness Visit  01/30/2024   Colon Cancer Screening  12/16/2026   DTaP/Tdap/Td vaccine (2 - Td or Tdap) 08/03/2028   Pneumonia Vaccine  Completed   DEXA scan (bone density measurement)  Completed   Hepatitis C Screening  Completed   Zoster (Shingles) Vaccine  Completed   HPV Vaccine  Aged Out   Meningitis B Vaccine  Aged Out  *Topic was postponed. The date shown is not the original due date.    Hyperlipidemia This is a chronic problem. The problem is controlled. Pertinent negatives include no chest pain, myalgias or shortness of breath. Current antihyperlipidemic treatment includes statins. The current treatment provides significant improvement of lipids.  Diabetes She presents for her follow-up diabetic visit. Diabetes type: preDM. Pertinent negatives for hypoglycemia include no dizziness or headaches. Pertinent negatives for diabetes include no chest pain, no fatigue and no weakness.  Renal stones - on hydrochlorothiazide and urocit-K from Urology recommendations. Seeing Urology in August.  Review of Systems  Constitutional:  Negative for fatigue and unexpected weight change.  HENT:  Negative for trouble swallowing.   Eyes:  Negative for visual disturbance.  Respiratory:  Negative for cough, chest tightness, shortness of breath and wheezing.   Cardiovascular:  Negative for chest pain, palpitations and leg swelling.  Gastrointestinal:  Negative for abdominal pain, constipation and diarrhea.   Musculoskeletal:  Negative for arthralgias and myalgias.  Neurological:  Negative for dizziness, weakness, light-headedness and headaches.     Lab Results  Component Value Date   NA 142 03/21/2023   K 3.8 03/21/2023   CO2 26 03/21/2023   GLUCOSE 94 03/21/2023   BUN 13 03/21/2023   CREATININE 0.70 03/21/2023   CALCIUM 10.1 03/21/2023   EGFR 94 03/21/2023   Lab Results  Component Value Date   CHOL 180 03/21/2023   HDL 54 03/21/2023   LDLCALC 96 03/21/2023   TRIG 176 (H) 03/21/2023   CHOLHDL 3.3 03/21/2023   Lab Results  Component Value Date   TSH 1.390 09/06/2021   Lab Results  Component Value Date   HGBA1C 6.0 (H) 03/21/2023   Lab Results  Component Value Date   WBC 6.8 09/10/2022   HGB 15.6 09/10/2022   HCT 46.8 (H) 09/10/2022   MCV 87 09/10/2022   PLT 341 09/10/2022   Lab Results  Component Value Date   ALT 19 03/21/2023   AST 15 03/21/2023   ALKPHOS 83 03/21/2023   BILITOT 0.8 03/21/2023   Lab Results  Component Value Date   VD25OH 35.5 09/06/2021     Patient Active Problem List   Diagnosis Date Noted   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/2023   Greater trochanteric pain syndrome of left lower extremity 06/27/2021   Prediabetes 03/29/2021   Osteopenia determined by x-ray 03/29/2021   Hypercholesterolemia 09/30/2013   Nephrolithiasis 09/30/2013   Double voiding 04/15/2013   Incomplete emptying of bladder 04/09/2012    Allergies  Allergen Reactions   Clindamycin Hives  Levofloxacin Hives   Penicillins Hives   Tetracycline Hives    Past Surgical History:  Procedure Laterality Date   CATARACT EXTRACTION W/PHACO Left 08/14/2023   Procedure: CATARACT EXTRACTION PHACO AND INTRAOCULAR LENS PLACEMENT (IOC) LEFT  5.60  00:33.4;  Surgeon: Annell Kidney, MD;  Location: [facility];  Service: Ophthalmology;  Laterality: Left;   CATARACT EXTRACTION W/PHACO Right 08/28/2023   Procedure: CATARACT EXTRACTION PHACO AND INTRAOCULAR LENS PLACEMENT (IOC)  RIGHT 6.47 00:42.9;  Surgeon: Annell Kidney, MD;  Location: [facility];  Service: Ophthalmology;  Laterality: Right;   CHOLECYSTECTOMY  1990   EXTRACORPOREAL SHOCK WAVE LITHOTRIPSY     TONSILLECTOMY AND ADENOIDECTOMY     TOTAL HIP ARTHROPLASTY Left 11/15/2022   URETEROLITHOTOMY  1970   WISDOM TOOTH EXTRACTION Bilateral     Social History   Tobacco Use   Smoking status: Never   Smokeless tobacco: Never  Vaping Use   Vaping status: Never Used  Substance Use Topics   Alcohol use: Not Currently   Drug use: Never     Medication list has been reviewed and updated.  Current Meds  Medication Sig   atorvastatin (LIPITOR) 10 MG tablet Take 1 tablet by mouth once daily   Calcium Citrate-Vitamin D (CALCIUM CITRATE + D3) 250-5 MG-MCG TABS Take by mouth.   carboxymethylcellulose (REFRESH PLUS) 0.5 % SOLN Place 1 drop into the left eye 2 (two) times daily as needed.   hydrochlorothiazide (HYDRODIURIL) 50 MG tablet Take 1 tablet (50 mg total) by mouth daily.   ketorolac (ACULAR) 0.5 % ophthalmic solution Place 1 drop into the right eye daily.   potassium citrate (UROCIT-K) 10 MEQ (1080 MG) SR tablet Take 1 tablet (10 mEq total) by mouth 2 (two) times daily with a meal.   prednisoLONE acetate (PRED FORTE) 1 % ophthalmic suspension Place 1 drop into the right eye daily.       09/19/2023    8:12 AM 09/10/2022    8:18 AM 03/15/2022   10:55 AM 10/31/2021   10:40 AM  GAD 7 : Generalized Anxiety Score  Nervous, Anxious, on Edge 0 0 0 0  Control/stop worrying 0 0 0 0  Worry too much - different things 0 0 0 0  Trouble relaxing 0 0 0 0  Restless 0 0 0 0  Easily annoyed or irritable 0 0 0 0  Afraid - awful might happen 0 0 0 0  Total GAD 7 Score 0 0 0 0  Anxiety Difficulty Not difficult at all Not difficult at all Not difficult at all Not difficult at all       09/19/2023    8:12 AM 01/30/2023    9:59 AM 01/30/2023    9:56 AM  Depression screen PHQ 2/9  Decreased Interest 1  0 0  Down, Depressed, Hopeless 0 0 0  PHQ - 2 Score 1 0 0  Altered sleeping 1    Tired, decreased energy 1    Change in appetite 1    Feeling bad or failure about yourself  0    Trouble concentrating 0    Moving slowly or fidgety/restless 0    Suicidal thoughts 0    PHQ-9 Score 4    Difficult doing work/chores Not difficult at all      BP Readings from Last 3 Encounters:  09/19/23 114/74  08/28/23 124/76  08/14/23 128/83    Physical Exam Vitals and nursing note reviewed.  Constitutional:      General: She is  not in acute distress.    Appearance: She is well-developed.  HENT:     Head: Normocephalic and atraumatic.     Right Ear: Tympanic membrane and ear canal normal.     Left Ear: Tympanic membrane and ear canal normal.     Nose:     Right Sinus: No maxillary sinus tenderness.     Left Sinus: No maxillary sinus tenderness.  Neck:     Thyroid: No thyromegaly.     Vascular: No carotid bruit.  Cardiovascular:     Rate and Rhythm: Normal rate and regular rhythm.     Pulses: Normal pulses.     Heart sounds: Normal heart sounds.  Pulmonary:     Effort: Pulmonary effort is normal. No respiratory distress.     Breath sounds: No wheezing.  Abdominal:     General: Bowel sounds are normal.     Palpations: Abdomen is soft.     Tenderness: There is no abdominal tenderness.  Musculoskeletal:     Cervical back: Normal range of motion. No erythema.     Right lower leg: No edema.     Left lower leg: No edema.  Lymphadenopathy:     Cervical: No cervical adenopathy.  Skin:    General: Skin is warm and dry.     Findings: No rash.  Neurological:     Mental Status: She is alert and oriented to person, place, and time.     Cranial Nerves: No cranial nerve deficit.     Sensory: No sensory deficit.     Deep Tendon Reflexes: Reflexes are normal and symmetric.  Psychiatric:        Attention and Perception: Attention normal.        Mood and Affect: Mood normal.     Wt Readings  from Last 3 Encounters:  09/19/23 195 lb 2 oz (88.5 kg)  08/28/23 192 lb 12.8 oz (87.5 kg)  08/14/23 192 lb (87.1 kg)    BP 114/74   Pulse 97   Ht 5' 7.99" (1.727 m)   Wt 195 lb 2 oz (88.5 kg)   SpO2 94%   BMI 29.68 kg/m   Assessment and Plan:  Problem List Items Addressed This Visit       Unprioritized   Hypercholesterolemia (Chronic)   LDL is  Lab Results  Component Value Date   LDLCALC 96 03/21/2023   Current regimen is atorvastatin.  No medication side effects noted. Goal LDL is <70.       Relevant Orders   Comprehensive metabolic panel with GFR   Lipid panel   TSH   Nephrolithiasis   On HCTZ and Urocit-k daily      Relevant Orders   CBC with Differential/Platelet   Comprehensive metabolic panel with GFR   Prediabetes (Chronic)   Managed with diet and exercise. Lab Results  Component Value Date   HGBA1C 6.0 (H) 03/21/2023         Relevant Orders   Comprehensive metabolic panel with GFR   Hemoglobin A1c   Osteopenia determined by x-ray   Continue Calcium and vitamin D Repeat DEXA in 2 years      Other Visit Diagnoses       Annual physical exam    -  Primary   Normal exam - continue healthy diet and exercise up to date on screenings and immunizations.     Encounter for screening mammogram for breast cancer       Relevant Orders   MM 3D  SCREENING MAMMOGRAM BILATERAL BREAST       Return in about 1 year (around 09/18/2024) for CPX - Dr Cari Char.    Sheron Dixons, MD [facility] [place]

## 2023-09-19 NOTE — Patient Instructions (Signed)
 Call [facility] to schedule your mammogram at [phone].

## 2023-09-19 NOTE — Assessment & Plan Note (Signed)
 On HCTZ and Urocit-k daily

## 2023-09-19 NOTE — Assessment & Plan Note (Signed)
 Continue Calcium and vitamin D Repeat DEXA in 2 years

## 2023-09-19 NOTE — Assessment & Plan Note (Signed)
 LDL is  Lab Results  Component Value Date   LDLCALC 96 03/21/2023   Current regimen is atorvastatin.  No medication side effects noted. Goal LDL is <70.

## 2023-09-21 LAB — COMPREHENSIVE METABOLIC PANEL WITH GFR
ALT: 23 IU/L (ref 0–32)
AST: 18 IU/L (ref 0–40)
Albumin: 4.4 g/dL (ref 3.9–4.9)
Alkaline Phosphatase: 85 IU/L (ref 44–121)
BUN/Creatinine Ratio: 20 (ref 12–28)
BUN: 15 mg/dL (ref 8–27)
Bilirubin Total: 0.9 mg/dL (ref 0.0–1.2)
CO2: 22 mmol/L (ref 20–29)
Calcium: 10.1 mg/dL (ref 8.7–10.3)
Chloride: 106 mmol/L (ref 96–106)
Creatinine, Ser: 0.74 mg/dL (ref 0.57–1.00)
Globulin, Total: 2.8 g/dL (ref 1.5–4.5)
Glucose: 100 mg/dL — ABNORMAL HIGH (ref 70–99)
Potassium: 3.9 mmol/L (ref 3.5–5.2)
Sodium: 148 mmol/L — ABNORMAL HIGH (ref 134–144)
Total Protein: 7.2 g/dL (ref 6.0–8.5)
eGFR: 88 mL/min/{1.73_m2} (ref >59)

## 2023-09-21 LAB — LIPID PANEL
Chol/HDL Ratio: 3.9 ratio (ref 0.0–4.4)
Cholesterol, Total: 200 mg/dL — ABNORMAL HIGH (ref 100–199)
HDL: 51 mg/dL (ref >39)
LDL Chol Calc (NIH): 111 mg/dL — ABNORMAL HIGH (ref 0–99)
Triglycerides: 220 mg/dL — ABNORMAL HIGH (ref 0–149)
VLDL Cholesterol Cal: 38 mg/dL (ref 5–40)

## 2023-09-21 LAB — CBC WITH DIFFERENTIAL/PLATELET
Basophils Absolute: 0.1 10*3/uL (ref 0.0–0.2)
Basos: 1 %
EOS (ABSOLUTE): 0.3 10*3/uL (ref 0.0–0.4)
Eos: 5 %
Hematocrit: 47.8 % — ABNORMAL HIGH (ref 34.0–46.6)
Hemoglobin: 16.2 g/dL — ABNORMAL HIGH (ref 11.1–15.9)
Immature Grans (Abs): 0.1 10*3/uL (ref 0.0–0.1)
Immature Granulocytes: 1 %
Lymphocytes Absolute: 2.2 10*3/uL (ref 0.7–3.1)
Lymphs: 36 %
MCH: 30.2 pg (ref 26.6–33.0)
MCHC: 33.9 g/dL (ref 31.5–35.7)
MCV: 89 fL (ref 79–97)
Monocytes Absolute: 0.7 10*3/uL (ref 0.1–0.9)
Monocytes: 11 %
Neutrophils Absolute: 2.8 10*3/uL (ref 1.4–7.0)
Neutrophils: 46 %
Platelets: 330 10*3/uL (ref 150–450)
RBC: 5.37 x10E6/uL — ABNORMAL HIGH (ref 3.77–5.28)
RDW: 12.5 % (ref 11.7–15.4)
WBC: 6.2 10*3/uL (ref 3.4–10.8)

## 2023-09-21 LAB — HEMOGLOBIN A1C
Est. average glucose Bld gHb Est-mCnc: 126 mg/dL
Hgb A1c MFr Bld: 6 % — ABNORMAL HIGH (ref 4.8–5.6)

## 2023-09-21 LAB — TSH: TSH: 1.96 u[IU]/mL (ref 0.450–4.500)

## 2023-09-22 ENCOUNTER — Encounter: Payer: Self-pay | Admitting: Internal Medicine

## 2023-09-25 ENCOUNTER — Telehealth: Payer: Self-pay

## 2023-09-25 NOTE — Telephone Encounter (Signed)
 Copied from CRM [id number]. Topic: General - Billing Inquiry >> Sep 25, 2023  8:16 AM Loreda Rodriguez T wrote: Reason for CRM: Gabriela Mullins from [facility] called stated patient was charged for an office visit when all she had was a physical for date of service 4/17. Please look into how this visit was coded.

## 2023-10-22 ENCOUNTER — Other Ambulatory Visit: Payer: Self-pay | Admitting: Internal Medicine

## 2023-10-22 DIAGNOSIS — N200 Calculus of kidney: Secondary | ICD-10-CM

## 2023-10-23 NOTE — Telephone Encounter (Signed)
 Requested Prescriptions  Pending Prescriptions Disp Refills   potassium citrate  (UROCIT-K ) 10 MEQ (1080 MG) SR tablet [Pharmacy Med Name: Potassium Citrate  ER 10 MEQ (1080 MG) Oral Tablet Extended Release] 180 tablet 0    Sig: TAKE 1 TABLET BY MOUTH TWICE DAILY WITH MEALS     Endocrinology:  Minerals - Potassium Citrate  Failed - 10/23/2023  3:28 PM      Failed - Na in normal range and within 120 days    Sodium  Date Value Ref Range Status  09/19/2023 148 (H) 134 - 144 mmol/L Final         Failed - HGB in normal range and within 120 days    Hemoglobin  Date Value Ref Range Status  09/19/2023 16.2 (H) 11.1 - 15.9 g/dL Final         Failed - HCT in normal range and within 120 days    Hematocrit  Date Value Ref Range Status  09/19/2023 47.8 (H) 34.0 - 46.6 % Final         Failed - Valid encounter within last 4 months    Recent Outpatient Visits           1 month ago Annual physical exam   [facility] at [facility], Chales [place], MD       Future Appointments             In 2 months Stoioff, Kizzie Perks, MD [facility] [place]   In 11 months Barnetta Liberty, MD [facility] at [facility], [facility]            Failed - Urinalysis completed in last 4 months.    Spec Grav, UA  Date Value Ref Range Status  09/06/2021 1.010 1.010 - 1.025 Final   Glucose, UA  Date Value Ref Range Status  09/06/2021 Negative Negative Final   Urobilinogen, UA  Date Value Ref Range Status  09/06/2021 0.2 0.2 or 1.0 E.U./dL Final   Protein, UA  Date Value Ref Range Status  09/06/2021 Negative Negative Final   Total Protein  Date Value Ref Range Status  09/19/2023 7.2 6.0 - 8.5 g/dL Final   Nitrite, UA  Date Value Ref Range Status  09/06/2021 neg  Final   No results found for: "RBCUA", "RBCU" No results found for: "WBCU"       Passed - CO2 in normal range and within 120 days    CO2  Date Value Ref  Range Status  09/19/2023 22 20 - 29 mmol/L Final         Passed - Cl in normal range and within 120 days    Chloride  Date Value Ref Range Status  09/19/2023 106 96 - 106 mmol/L Final         Passed - Cr in normal range and within 120 days    Creatinine, Ser  Date Value Ref Range Status  09/19/2023 0.74 0.57 - 1.00 mg/dL Final         Passed - K in normal range and within 120 days    Potassium  Date Value Ref Range Status  09/19/2023 3.9 3.5 - 5.2 mmol/L Final         Passed - WBC in normal range and within 120 days    WBC  Date Value Ref Range Status  09/19/2023 6.2 3.4 - 10.8 x10E3/uL Final         Passed - PLT in  normal range and within 120 days    Platelets  Date Value Ref Range Status  09/19/2023 330 150 - 450 x10E3/uL Final

## 2023-11-06 IMAGING — MG MM DIGITAL SCREENING BILAT W/ TOMO AND CAD
8 series · 8 of 24 positions shown · non-contrast
Comparison: Previous exam(s).

CLINICAL DATA: Screening.

EXAM:
DIGITAL SCREENING BILATERAL MAMMOGRAM WITH TOMOSYNTHESIS AND CAD
TECHNIQUE: Bilateral screening digital craniocaudal and mediolateral oblique
mammograms were obtained. Bilateral screening digital breast
tomosynthesis was performed. The images were evaluated with
computer-aided detection.

[R CC synth-2D]
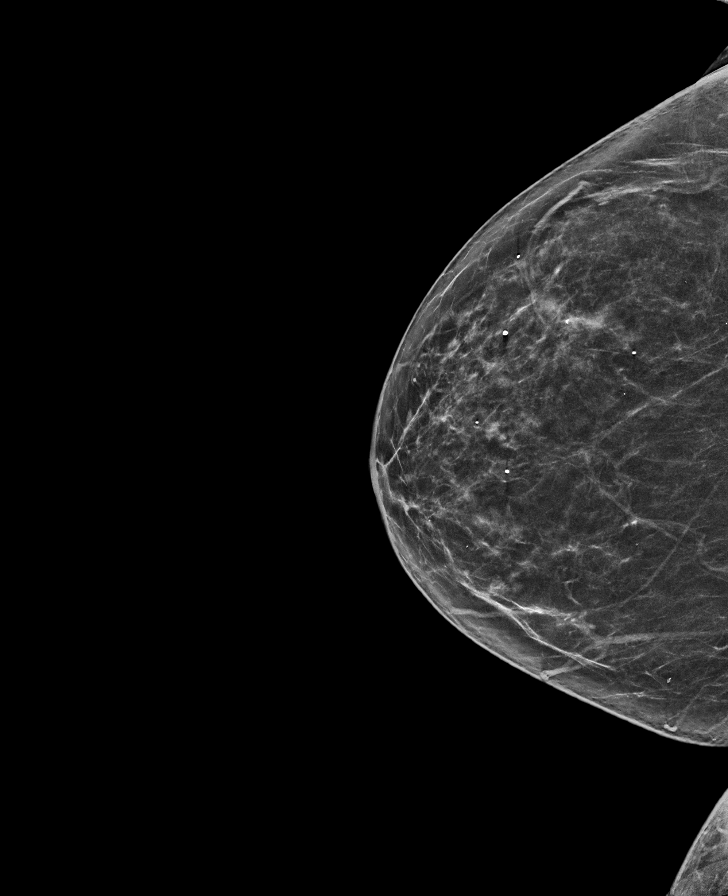

[L MLO synth-2D]
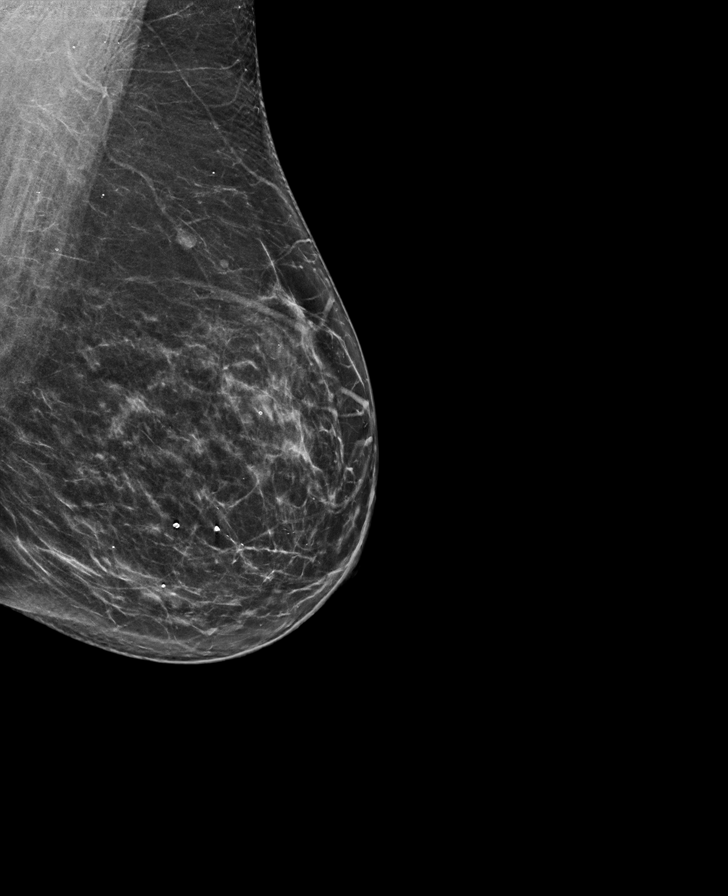

[L CC synth-2D]
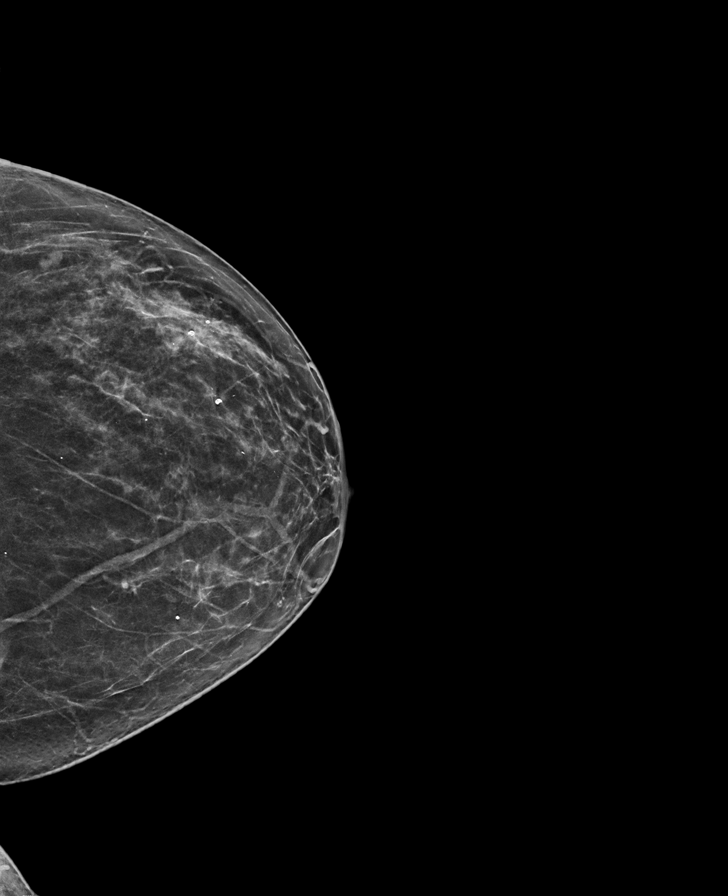

[R MLO synth-2D]
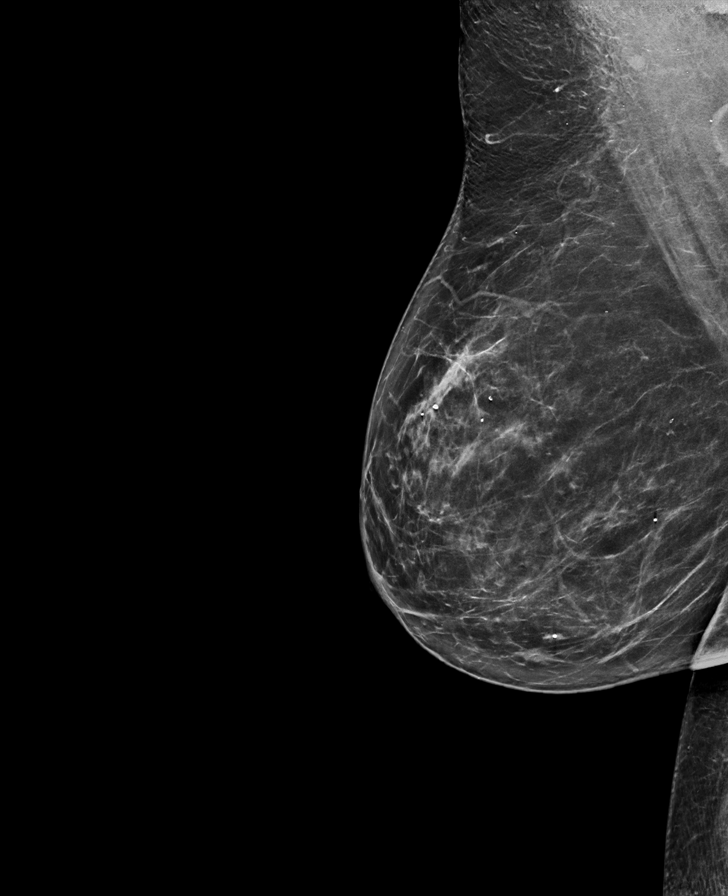

[L MLO tomo · tomo slice 39/78.0]
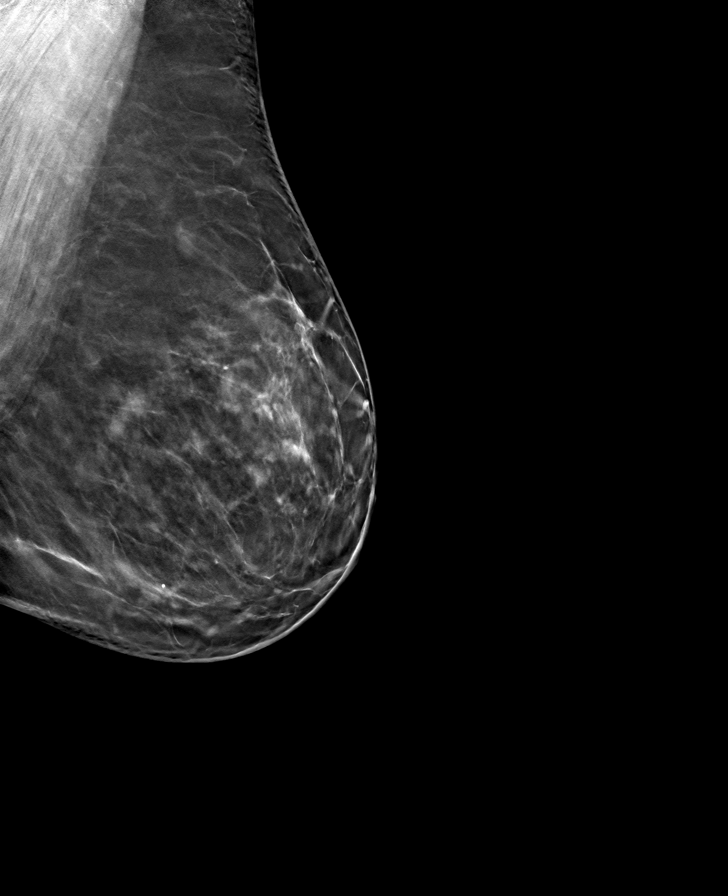

[R CC tomo · tomo slice 36/71.0]
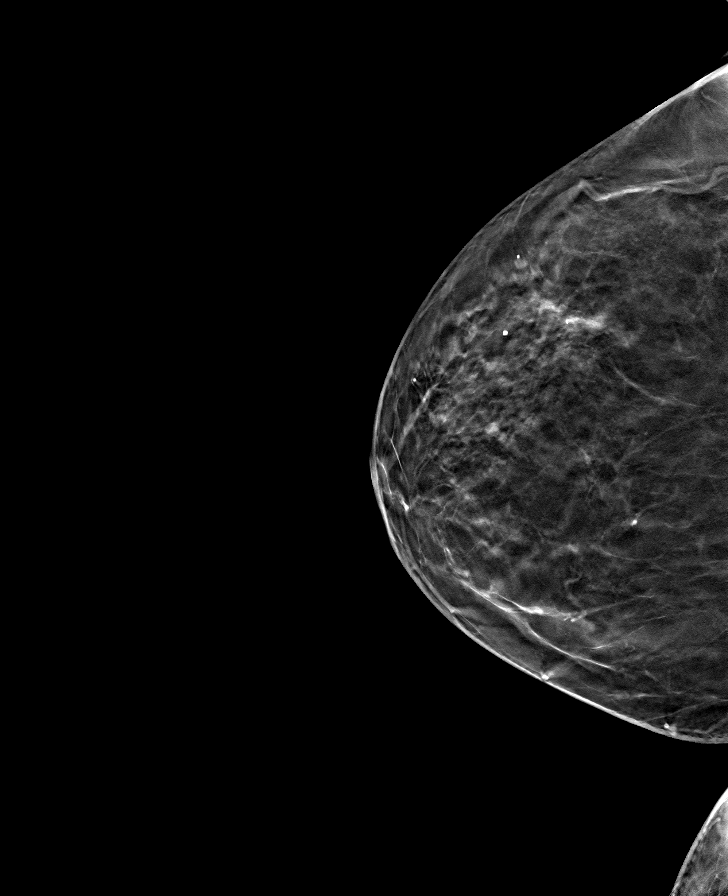

[R MLO tomo · tomo slice 41/81.0]
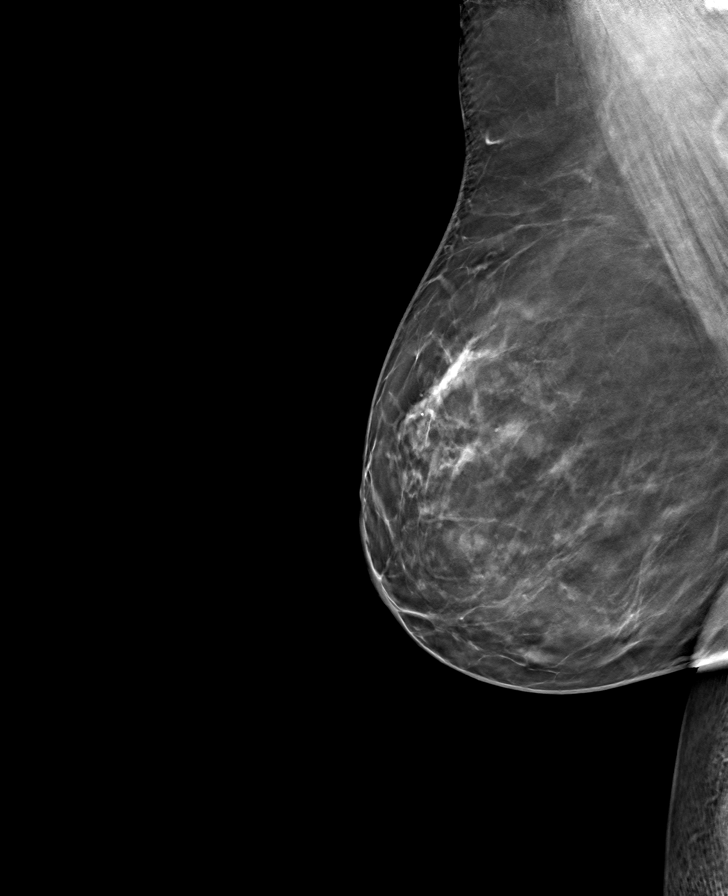

[L CC tomo · tomo slice 36/71.0]
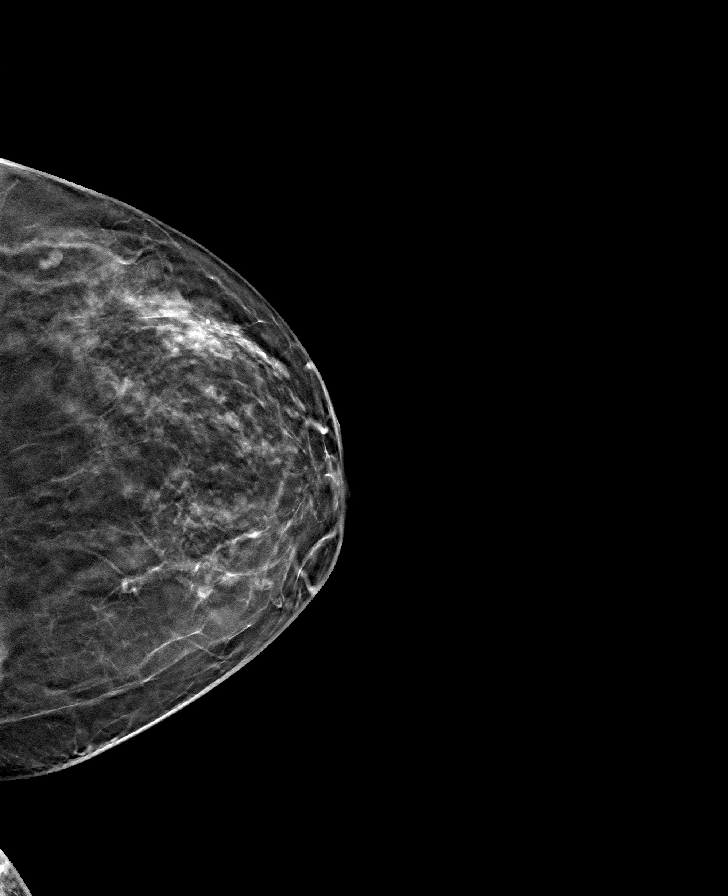

[8 of 24 positions shown; findings below may reference images not displayed]

ACR Breast Density Category c: The breast tissue is heterogeneously
dense, which may obscure small masses.
FINDINGS: There are no findings suspicious for malignancy.
IMPRESSION: No mammographic evidence of malignancy. A result letter of this
screening mammogram will be mailed directly to the patient.

RECOMMENDATION:
Screening mammogram in one year. (Code:[id number])

BI-RADS CATEGORY  1: Negative.

## 2023-11-11 ENCOUNTER — Ambulatory Visit
Admission: RE | Admit: 2023-11-11 | Discharge: 2023-11-11 | Disposition: A | Discharge status: Home / Self Care | Source: Ambulatory Visit | Attending: Internal Medicine | Admitting: Internal Medicine

## 2023-11-11 DIAGNOSIS — Z1231 Encounter for screening mammogram for malignant neoplasm of breast: Secondary | ICD-10-CM | POA: Insufficient documentation

## 2023-12-03 ENCOUNTER — Other Ambulatory Visit: Payer: Self-pay | Admitting: *Deleted

## 2023-12-03 DIAGNOSIS — N200 Calculus of kidney: Secondary | ICD-10-CM

## 2023-12-07 ENCOUNTER — Other Ambulatory Visit: Payer: Self-pay | Admitting: Internal Medicine

## 2023-12-07 DIAGNOSIS — E7800 Pure hypercholesterolemia, unspecified: Secondary | ICD-10-CM

## 2023-12-10 NOTE — Telephone Encounter (Signed)
 Requested Prescriptions  Pending Prescriptions Disp Refills   atorvastatin  (LIPITOR) 10 MG tablet [Pharmacy Med Name: Atorvastatin  Calcium  10 MG Oral Tablet] 90 tablet 2    Sig: Take 1 tablet by mouth once daily     Cardiovascular:  Antilipid - Statins Failed - 12/10/2023 12:09 PM      Failed - Lipid Panel in normal range within the last 12 months    Cholesterol, Total  Date Value Ref Range Status  09/19/2023 200 (H) 100 - 199 mg/dL Final   LDL Chol Calc (NIH)  Date Value Ref Range Status  09/19/2023 111 (H) 0 - 99 mg/dL Final   HDL  Date Value Ref Range Status  09/19/2023 51 >39 mg/dL Final   Triglycerides  Date Value Ref Range Status  09/19/2023 220 (H) 0 - 149 mg/dL Final         Passed - Patient is not pregnant      Passed - Valid encounter within last 12 months    Recent Outpatient Visits           2 months ago Annual physical exam   [facility] at [facility], Leita DEL, MD       Future Appointments             In 1 month Stoioff, Glendia BROCKS, MD [facility]    In 9 months Lemon Raisin, MD [facility] at [facility], [facility]

## 2024-01-13 ENCOUNTER — Ambulatory Visit: Payer: Medicare PPO | Admitting: Urology

## 2024-01-13 ENCOUNTER — Ambulatory Visit
Admission: RE | Admit: 2024-01-13 | Discharge: 2024-01-13 | Disposition: A | Discharge status: Home / Self Care | Attending: Urology | Admitting: Urology

## 2024-01-13 ENCOUNTER — Ambulatory Visit
Admission: RE | Admit: 2024-01-13 | Discharge: 2024-01-13 | Disposition: A | Discharge status: Home / Self Care | Source: Ambulatory Visit | Attending: Urology | Admitting: Urology

## 2024-01-13 DIAGNOSIS — N200 Calculus of kidney: Secondary | ICD-10-CM

## 2024-01-15 ENCOUNTER — Encounter: Payer: Self-pay | Admitting: Urology

## 2024-01-15 ENCOUNTER — Ambulatory Visit: Payer: Self-pay | Admitting: Urology

## 2024-01-15 VITALS — BP 152/89 | HR 74 | Ht 68.5 in | Wt 200.0 lb

## 2024-01-15 DIAGNOSIS — N200 Calculus of kidney: Secondary | ICD-10-CM

## 2024-01-15 NOTE — Progress Notes (Signed)
 01/15/2024 8:36 AM   Gabriela Mullins [birth year] [id number]  Referring provider: Justus Leita DEL, MD [address] [place],  [place] [address]  Chief Complaint  Patient presents with   Follow-up    Urologic history: 1.  Bilateral nephrolithiasis -Small, bilateral renal calculi -Prior ESWL x2 -Open ureterolithotomy late 1970s -On potassium citrate , HCTZ   HPI: 68 y.o. female presents for 2-year follow-up.  Doing well since last  visit Denies flank, abdominal, pelvic pain/renal colic No bothersome LUTS Does have nocturia x 3   PMH: Hyperlipidemia  Surgical History: Cholecystectomy ESWL Ureterolithotomy  Home Medications:  Allergies as of 01/15/2024       Reactions   Clindamycin Hives   Levofloxacin Hives   Penicillins Hives   Tetracycline Hives        Medication List        Accurate as of January 15, 2024  8:36 AM. If you have any questions, ask your nurse or doctor.          STOP taking these medications    carboxymethylcellulose 0.5 % Soln Commonly known as: REFRESH PLUS Stopped by: Glendia JAYSON Barba   ketorolac 0.5 % ophthalmic solution Commonly known as: ACULAR Stopped by: Glendia JAYSON Barba   prednisoLONE acetate 1 % ophthalmic suspension Commonly known as: PRED FORTE Stopped by: Glendia JAYSON Barba       TAKE these medications    atorvastatin  10 MG tablet Commonly known as: LIPITOR Take 1 tablet by mouth once daily   Calcium  Citrate + D3 250-5 MG-MCG Tabs Generic drug: Calcium  Citrate-Vitamin D Take by mouth.   hydrochlorothiazide  50 MG tablet Commonly known as: HYDRODIURIL  Take 1 tablet (50 mg total) by mouth daily.   potassium citrate  10 MEQ (1080 MG) SR tablet Commonly known as: UROCIT-K  TAKE 1 TABLET BY MOUTH TWICE DAILY WITH MEALS        Allergies:  Allergies  Allergen Reactions   Clindamycin Hives   Levofloxacin Hives   Penicillins Hives   Tetracycline Hives    Family History: Family History   Problem Relation Age of Onset   Arthritis Mother    Cancer Mother    Dementia Mother    Heart attack Father    Heart murmur Father    Hernia Brother    Factor V Leiden deficiency Daughter    Clotting disorder Daughter    Heart attack Paternal Grandfather    Breast cancer Neg Hx     Social History:  reports that she has never smoked. She has never used smokeless tobacco. She reports that she does not currently use alcohol. She reports that she does not use drugs.   Physical Exam: BP (!) 152/89   Pulse 74   Ht 5' 8.5 (1.74 m)   Wt 200 lb (90.7 kg)   BMI 29.97 kg/m   Constitutional:  Alert and oriented, No acute distress. HEENT: [place] AT Respiratory: Normal respiratory effort, no increased work of breathing.   Pertinent Imaging: KUB performed earlier this morning was personally reviewed and interpreted.  Stable bilateral renal calcifications: Right lower pole measuring 7 mm and left midpole measuring 4 mm    Assessment & Plan:    1.Bilateral nephrolithiasis Stable, bilateral renal calculi Follow-up 2 years with KUB Call earlier for recurrent flank pain/stone symptoms  2.  Nocturia States she does snore and we discussed sleep apnea is a common cause of nocturia.  Consider sleep study ordered by PCP   Glendia JAYSON Barba, MD  [facility] [address] [place], [place] [address] [phone]

## 2024-02-12 ENCOUNTER — Ambulatory Visit: Admitting: Emergency Medicine

## 2024-02-12 VITALS — Ht 69.0 in | Wt 205.0 lb

## 2024-02-12 DIAGNOSIS — Z0000 Encounter for general adult medical examination without abnormal findings: Secondary | ICD-10-CM

## 2024-02-12 NOTE — Patient Instructions (Signed)
 [name],  Thank you for taking the time for your Medicare Wellness Visit. I appreciate your continued commitment to your health goals. Please review the care plan we discussed, and feel free to reach out if I can assist you further.  Medicare recommends these wellness visits once per year to help you and your care team stay ahead of potential health issues. These visits are designed to focus on prevention, allowing your provider to concentrate on managing your acute and chronic conditions during your regular appointments.  Please note that Annual Wellness Visits do not include a physical exam. Some assessments may be limited, especially if the visit was conducted virtually. If needed, we may recommend a separate in-person follow-up with your provider.  Ongoing Care Seeing your primary care provider every 3 to 6 months helps us  monitor your health and provide consistent, personalized care.   Referrals If a referral was made during today's visit and you haven't received any updates within two weeks, please contact the referred provider directly to check on the status.  Recommended Screenings: Get the flu vaccine at your convenience.  Health Maintenance  Topic Date Due   COVID-19 Vaccine (3 - Moderna risk series) 01/13/2020   Flu Shot  01/03/2024   Mammogram  11/10/2024   Medicare Annual Wellness Visit  02/11/2025   Colon Cancer Screening  12/16/2026   DEXA scan (bone density measurement)  11/08/2027   DTaP/Tdap/Td vaccine (2 - Td or Tdap) 08/03/2028   Pneumococcal Vaccine for age over 70  Completed   Hepatitis C Screening  Completed   Zoster (Shingles) Vaccine  Completed   HPV Vaccine  Aged Out   Meningitis B Vaccine  Aged Out       02/12/2024    2:45 PM  Advanced Directives  Does Patient Have a Medical Advance Directive? No  Would patient like information on creating a medical advance directive? No - Patient declined   Advance Care Planning is important because it: Ensures  you receive medical care that aligns with your values, goals, and preferences. Provides guidance to your family and loved ones, reducing the emotional burden of decision-making during critical moments.  Vision: Annual vision screenings are recommended for early detection of glaucoma, cataracts, and diabetic retinopathy. These exams can also reveal signs of chronic conditions such as diabetes and high blood pressure.  Dental: Annual dental screenings help detect early signs of oral cancer, gum disease, and other conditions linked to overall health, including heart disease and diabetes.  Please see the attached documents for additional preventive care recommendations.   Fall Prevention in the Home, Adult Falls can cause injuries and affect people of all ages. There are many simple things that you can do to make your home safe and to help prevent falls. If you need it, ask for help making these changes. What actions can I take to prevent falls? General information Use good lighting in all rooms. Make sure to: Replace any light bulbs that burn out. Turn on lights if it is dark and use night-lights. Keep items that you use often in easy-to-reach places. Lower the shelves around your home if needed. Move furniture so that there are clear paths around it. Do not keep throw rugs or other things on the floor that can make you trip. If any of your floors are uneven, fix them. Add color or contrast paint or tape to clearly mark and help you see: Grab bars or handrails. First and last steps of staircases. Where the edge  of each step is. If you use a ladder or stepladder: Make sure that it is fully opened. Do not climb a closed ladder. Make sure the sides of the ladder are locked in place. Have someone hold the ladder while you use it. Know where your pets are as you move through your home. What can I do in the bathroom?     Keep the floor dry. Clean up any water that is on the floor right  away. Remove soap buildup in the bathtub or shower. Buildup makes bathtubs and showers slippery. Use non-skid mats or decals on the floor of the bathtub or shower. Attach bath mats securely with double-sided, non-slip rug tape. If you need to sit down while you are in the shower, use a non-slip stool. Install grab bars by the toilet and in the bathtub and shower. Do not use towel bars as grab bars. What can I do in the bedroom? Make sure that you have a light by your bed that is easy to reach. Do not use any sheets or blankets on your bed that hang to the floor. Have a firm bench or chair with side arms that you can use for support when you get dressed. What can I do in the kitchen? Clean up any spills right away. If you need to reach something above you, use a sturdy step stool that has a grab bar. Keep electrical cables out of the way. Do not use floor polish or wax that makes floors slippery. What can I do with my stairs? Do not leave anything on the stairs. Make sure that you have a light switch at the top and the bottom of the stairs. Have them installed if you do not have them. Make sure that there are handrails on both sides of the stairs. Fix handrails that are broken or loose. Make sure that handrails are as long as the staircases. Install non-slip stair treads on all stairs in your home if they do not have carpet. Avoid having throw rugs at the top or bottom of stairs, or secure the rugs with carpet tape to prevent them from moving. Choose a carpet design that does not hide the edge of steps on the stairs. Make sure that carpet is firmly attached to the stairs. Fix any carpet that is loose or worn. What can I do on the outside of my home? Use bright outdoor lighting. Repair the edges of walkways and driveways and fix any cracks. Clear paths of anything that can make you trip, such as tools or rocks. Add color or contrast paint or tape to clearly mark and help you see high doorway  thresholds. Trim any bushes or trees on the main path into your home. Check that handrails are securely fastened and in good repair. Both sides of all steps should have handrails. Install guardrails along the edges of any raised decks or porches. Have leaves, snow, and ice cleared regularly. Use sand, salt, or ice melt on walkways during winter months if you live where there is ice and snow. In the garage, clean up any spills right away, including grease or oil spills. What other actions can I take? Review your medicines with your health care provider. Some medicines can make you confused or feel dizzy. This can increase your chance of falling. Wear closed-toe shoes that fit well and support your feet. Wear shoes that have rubber soles and low heels. Use a cane, walker, scooter, or crutches that help you move around  if needed. Talk with your provider about other ways that you can decrease your risk of falls. This may include seeing a physical therapist to learn to do exercises to improve movement and strength. Where to find more information Centers for Disease Control and Prevention, STEADI: TonerPromos.no [employer] on Aging: BaseRingTones.pl National Institute on Aging: BaseRingTones.pl Contact a health care provider if: You are afraid of falling at home. You feel weak, drowsy, or dizzy at home. You fall at home. Get help right away if you: Lose consciousness or have trouble moving after a fall. Have a fall that causes a head injury. These symptoms may be an emergency. Get help right away. Call 911. Do not wait to see if the symptoms will go away. Do not drive yourself to the hospital. This information is not intended to replace advice given to you by your health care provider. Make sure you discuss any questions you have with your health care provider. Document Revised: 01/22/2022 Document Reviewed: 01/22/2022 Elsevier Patient Education  2024 [employer].

## 2024-02-12 NOTE — Progress Notes (Signed)
 Subjective:   Gabriela Mullins is a 68 y.o. who presents for a Medicare Wellness preventive visit.  As a reminder, Annual Wellness Visits don't include a physical exam, and some assessments may be limited, especially if this visit is performed virtually. We may recommend an in-person follow-up visit with your provider if needed.  Visit Complete: Virtual I connected with  Gabriela Mullins on 02/12/24 by a audio enabled telemedicine application and verified that I am speaking with the correct person using two identifiers.  Patient Location: Home  Provider Location: Home Office  I discussed the limitations of evaluation and management by telemedicine. The patient expressed understanding and agreed to proceed.  Vital Signs: Because this visit was a virtual/telehealth visit, some criteria may be missing or patient reported. Any vitals not documented were not able to be obtained and vitals that have been documented are patient reported.  VideoDeclined- This patient declined Librarian, academic. Therefore the visit was completed with audio only.  Persons Participating in Visit: Patient.  AWV Questionnaire: No: Patient Medicare AWV questionnaire was not completed prior to this visit.  Cardiac Risk Factors include: advanced age (>39men, >55 women);dyslipidemia;obesity (BMI >30kg/m2);Other (see comment), Risk factor comments: prediabetic     Objective:    Today's Vitals   02/12/24 1439  Weight: 205 lb (93 kg)  Height: 5' 9 (1.753 m)   Body mass index is 30.27 kg/m.     02/12/2024    2:45 PM 08/28/2023   11:10 AM 08/14/2023   11:04 AM 01/30/2023    9:57 AM 10/02/2021    1:14 PM 11/11/2020    6:59 PM  Advanced Directives  Does Patient Have a Medical Advance Directive? No  No No No No  Would patient like information on creating a medical advance directive? No - Patient declined No - Patient declined No - Patient declined No - Patient declined No - Patient  declined     Current Medications (verified) Outpatient Encounter Medications as of 02/12/2024  Medication Sig   atorvastatin  (LIPITOR) 10 MG tablet Take 1 tablet by mouth once daily   Calcium  Citrate-Vitamin D (CALCIUM  CITRATE + D3) 250-5 MG-MCG TABS Take by mouth.   hydrochlorothiazide  (HYDRODIURIL ) 50 MG tablet Take 1 tablet (50 mg total) by mouth daily.   potassium citrate  (UROCIT-K ) 10 MEQ (1080 MG) SR tablet TAKE 1 TABLET BY MOUTH TWICE DAILY WITH MEALS   No facility-administered encounter medications on file as of 02/12/2024.    Allergies (verified) Clindamycin, Levofloxacin, Penicillins, and Tetracycline   History: Past Medical History:  Diagnosis Date   Allergy    See list   Arthritis 03/16/2021   My left hip   Cataract 2021   Complication of anesthesia    BP dropped after hip replacement and gall bladder surgery   Family history of adverse reaction to anesthesia    Mother - slow to wake   Hyperlipidemia    Nephrolithiasis    Osteopenia    PONV (postoperative nausea and vomiting)    Pre-diabetes    Past Surgical History:  Procedure Laterality Date   CATARACT EXTRACTION W/PHACO Left 08/14/2023   Procedure: CATARACT EXTRACTION PHACO AND INTRAOCULAR LENS PLACEMENT (IOC) LEFT  5.60  00:33.4;  Surgeon: Mittie Gaskin, MD;  Location[facility];  Service: Ophthalmology;  Laterality: Left;   CATARACT EXTRACTION W/PHACO Right 08/28/2023   Procedure: CATARACT EXTRACTION PHACO AND INTRAOCULAR LENS PLACEMENT (IOC) RIGHT 6.47 00:42.9;  Surgeon: Mittie Gaskin, MD;  Location: [facility];  Service: Ophthalmology;  Laterality: Right;   CHOLECYSTECTOMY  1990   EXTRACORPOREAL SHOCK WAVE LITHOTRIPSY     TONSILLECTOMY AND ADENOIDECTOMY     TOTAL HIP ARTHROPLASTY Left 11/15/2022   URETEROLITHOTOMY  1970   WISDOM TOOTH EXTRACTION Bilateral    Family History  Problem Relation Age of Onset   Arthritis Mother    Cancer Mother    Dementia Mother     Varicose Veins Mother    Heart attack Father    Heart murmur Father    Stroke Father    Hernia Brother    Factor V Leiden deficiency Daughter    Clotting disorder Daughter    Heart attack Paternal Grandfather    Breast cancer Neg Hx    Social History   Socioeconomic History   Marital status: Married    Spouse name: Gabriela Mullins   Number of children: 2   Years of education: 16   Highest education level: Bachelor's degree (e.g., BA, AB, BS)  Occupational History   Occupation: Retired  Tobacco Use   Smoking status: Never   Smokeless tobacco: Never  Vaping Use   Vaping status: Never Used  Substance and Sexual Activity   Alcohol use: Not Currently   Drug use: Never   Sexual activity: Yes    Partners: Male  Other Topics Concern   Not on file  Social History Narrative   Not on file   Social Drivers of Health   Financial Resource Strain: Low Risk  (02/12/2024)   Overall Financial Resource Strain (CARDIA)    Difficulty of Paying Living Expenses: Not hard at all  Food Insecurity: No Food Insecurity (02/12/2024)   Hunger Vital Sign    Worried About Running Out of Food in the Last Year: Never true    Ran Out of Food in the Last Year: Never true  Transportation Needs: No Transportation Needs (02/12/2024)   PRAPARE - Administrator, Civil Service (Medical): No    Lack of Transportation (Non-Medical): No  Physical Activity: Insufficiently Active (02/12/2024)   Exercise Vital Sign    Days of Exercise per Week: 2 days    Minutes of Exercise per Session: 30 min  Stress: No Stress Concern Present (02/12/2024)   [employer] of Occupational Health - Occupational Stress Questionnaire    Feeling of Stress: Not at all  Social Connections: Socially Integrated (02/12/2024)   Social Connection and Isolation Panel    Frequency of Communication with Friends and Family: More than three times a week    Frequency of Social Gatherings with Friends and Family: Twice a week     Attends Religious Services: More than 4 times per year    Active Member of [employer] or Organizations: Yes    Attends Engineer, structural: More than 4 times per year    Marital Status: Married    Tobacco Counseling Counseling given: Not Answered    Clinical Intake:  Pre-visit preparation completed: Yes  Pain : No/denies pain     BMI - recorded: 30.27 Nutritional Status: BMI > 30  Obese Nutritional Risks: None Diabetes: No  Lab Results  Component Value Date   HGBA1C 6.0 (H) 09/19/2023   HGBA1C 6.0 (H) 03/21/2023   HGBA1C 5.8 (H) 09/10/2022     How often do you need to have someone help you when you read instructions, pamphlets, or other written materials from your doctor or pharmacy?: 1 - Never  Interpreter Needed?: No  Information entered by :: Vina Ned, CMA  Activities of Daily Living     02/12/2024    2:40 PM 02/06/2024    4:37 PM  In your present state of health, do you have any difficulty performing the following activities:  Hearing? 0 0  Vision? 0 0  Difficulty concentrating or making decisions? 0 0  Walking or climbing stairs? 0 0  Dressing or bathing? 0 0  Doing errands, shopping? 0 0  Preparing Food and eating ? N N  Using the Toilet? N N  In the past six months, have you accidently leaked urine? N N  Do you have problems with loss of bowel control? N N  Managing your Medications? N N  Managing your Finances? N N  Housekeeping or managing your Housekeeping? N N    Patient Care Team: Justus Leita DEL, MD as PCP - General (Internal Medicine) Twylla Glendia BROCKS, MD (Urology) Sharlot Agent, OD (Optometry)  I have updated your Care Teams any recent Medical Services you may have received from other providers in the past year.     Assessment:   This is a routine wellness examination for [name].  Hearing/Vision screen Hearing Screening - Comments:: Denies hearing loss  Vision Screening - Comments:: Gets routine eye exams, Dr. Agent Sharlot,  [facility]   Goals Addressed               This Visit's Progress     Weight (lb) < 165 lb (74.8 kg) (pt-stated)         Depression Screen     02/12/2024    2:42 PM 09/19/2023    8:12 AM 01/30/2023    9:59 AM 01/30/2023    9:56 AM 09/10/2022    8:18 AM 03/15/2022   10:55 AM 10/31/2021   10:40 AM  PHQ 2/9 Scores  PHQ - 2 Score 0 1 0 0 1 0 1  PHQ- 9 Score 2 4   3  0 6    Fall Risk     02/12/2024    2:47 PM 02/06/2024    4:37 PM 09/19/2023    8:06 AM 01/30/2023    9:58 AM 09/10/2022    8:19 AM  Fall Risk   Falls in the past year? 0 1 0 0 0  Number falls in past yr: 0 0  0 0  Injury with Fall? 0 0  0 0  Risk for fall due to : No Fall Risks   No Fall Risks No Fall Risks  Follow up Falls evaluation completed   Falls evaluation completed Falls evaluation completed    MEDICARE RISK AT HOME:  Medicare Risk at Home Any stairs in or around the home?: No If so, are there any without handrails?: No Home free of loose throw rugs in walkways, pet beds, electrical cords, etc?: Yes Adequate lighting in your home to reduce risk of falls?: Yes Life alert?: No Use of a cane, walker or w/c?: No Grab bars in the bathroom?: No Shower chair or bench in shower?: Yes Elevated toilet seat or a handicapped toilet?: Yes  TIMED UP AND GO:  Was the test performed?  No  Cognitive Function: 6CIT completed        02/12/2024    2:48 PM 01/30/2023    9:59 AM  6CIT Screen  What Year? 0 points 0 points  What month? 0 points 0 points  What time? 0 points 0 points  Count back from 20 0 points 0 points  Months in reverse 0 points 0 points  Repeat phrase 0 points 0 points  Total Score 0 points 0 points    Immunizations Immunization History  Administered Date(s) Administered   Fluad Quad(high Dose 65+) 03/29/2021, 03/13/2022   Fluad Trivalent(High Dose 65+) 03/21/2023   Influenza Inj Mdck Quad Pf 02/20/2019   Influenza,inj,Quad PF,6+ Mos 03/04/2020   Moderna Sars-Covid-2 Vaccination  11/18/2019, 12/16/2019   PNEUMOCOCCAL CONJUGATE-20 03/29/2021   Tdap 08/04/2018   Zoster Recombinant(Shingrix) 04/09/2019, 07/14/2019    Screening Tests Health Maintenance  Topic Date Due   COVID-19 Vaccine (3 - Moderna risk series) 01/13/2020   Influenza Vaccine  01/03/2024   MAMMOGRAM  11/10/2024   Medicare Annual Wellness (AWV)  02/11/2025   Colonoscopy  12/16/2026   DEXA SCAN  11/08/2027   DTaP/Tdap/Td (2 - Td or Tdap) 08/03/2028   Pneumococcal Vaccine: 50+ Years  Completed   Hepatitis C Screening  Completed   Zoster Vaccines- Shingrix  Completed   HPV VACCINES  Aged Out   Meningococcal B Vaccine  Aged Out    Health Maintenance Items Addressed: See Nurse Notes at the end of this note  Additional Screening:  Vision Screening: Recommended annual ophthalmology exams for early detection of glaucoma and other disorders of the eye. Is the patient up to date with their annual eye exam?  Yes  Who is the provider or what is the name of the office in which the patient attends annual eye exams? Dr. Lynwood Hasting @ [facility] [place]  Dental Screening: Recommended annual dental exams for proper oral hygiene  Community Resource Referral / Chronic Care Management: CRR required this visit?  No   CCM required this visit?  No   Plan:    I have personally reviewed and noted the following in the patient's chart:   Medical and social history Use of alcohol, tobacco or illicit drugs  Current medications and supplements including opioid prescriptions. Patient is not currently taking opioid prescriptions. Functional ability and status Nutritional status Physical activity Advanced directives List of other physicians Hospitalizations, surgeries, and ER visits in previous 12 months Vitals Screenings to include cognitive, depression, and falls Referrals and appointments  In addition, I have reviewed and discussed with patient certain preventive protocols, quality metrics, and best  practice recommendations. A written personalized care plan for preventive services as well as general preventive health recommendations were provided to patient.   Vina Ned, CMA   02/12/2024   After Visit Summary: (MyChart) Due to this being a telephonic visit, the after visit summary with patients personalized plan was offered to patient via MyChart   Notes:  Will get flu vaccine Declined Covid vaccine

## 2024-02-16 ENCOUNTER — Encounter: Payer: Self-pay | Admitting: Internal Medicine

## 2024-02-17 ENCOUNTER — Other Ambulatory Visit: Payer: Self-pay

## 2024-02-17 DIAGNOSIS — E7800 Pure hypercholesterolemia, unspecified: Secondary | ICD-10-CM

## 2024-02-17 DIAGNOSIS — N200 Calculus of kidney: Secondary | ICD-10-CM

## 2024-02-17 MED ORDER — ATORVASTATIN CALCIUM 10 MG PO TABS: 10.0000 mg | ORAL_TABLET | Freq: Every day | ORAL | 0 refills | Status: AC

## 2024-02-17 MED ORDER — HYDROCHLOROTHIAZIDE 50 MG PO TABS: 50.0000 mg | ORAL_TABLET | Freq: Every day | ORAL | 0 refills | Status: DC

## 2024-02-17 MED ORDER — POTASSIUM CITRATE ER 10 MEQ (1080 MG) PO TBCR: 10.0000 meq | EXTENDED_RELEASE_TABLET | Freq: Two times a day (BID) | ORAL | 0 refills | Status: AC

## 2024-04-16 DIAGNOSIS — L820 Inflamed seborrheic keratosis: Secondary | ICD-10-CM | POA: Diagnosis not present

## 2024-04-16 DIAGNOSIS — L538 Other specified erythematous conditions: Secondary | ICD-10-CM | POA: Diagnosis not present

## 2024-04-17 ENCOUNTER — Ambulatory Visit (INDEPENDENT_AMBULATORY_CARE_PROVIDER_SITE_OTHER)

## 2024-04-17 DIAGNOSIS — Z23 Encounter for immunization: Secondary | ICD-10-CM

## 2024-05-13 ENCOUNTER — Other Ambulatory Visit: Payer: Self-pay | Admitting: Internal Medicine

## 2024-05-13 DIAGNOSIS — N200 Calculus of kidney: Secondary | ICD-10-CM

## 2024-05-14 DIAGNOSIS — Z961 Presence of intraocular lens: Secondary | ICD-10-CM | POA: Diagnosis not present

## 2024-05-14 NOTE — Telephone Encounter (Signed)
 Requested medication (s) are due for refill today: yes   Requested medication (s) are on the active medication list: yes   Last refill:  02/17/24 #90 0 refills  Future visit scheduled: yes 09/22/24  Notes to clinic:  protocol failed last labs 09/19/23. Do you want to refill Rx?     Requested Prescriptions  Pending Prescriptions Disp Refills   hydrochlorothiazide  (HYDRODIURIL ) 50 MG tablet [Pharmacy Med Name: hydroCHLOROthiazide  50 MG Oral Tablet] 90 tablet 0    Sig: Take 1 tablet by mouth once daily     Cardiovascular: Diuretics - Thiazide Failed - 05/14/2024  3:54 PM      Failed - Cr in normal range and within 180 days    Creatinine, Ser  Date Value Ref Range Status  09/19/2023 0.74 0.57 - 1.00 mg/dL Final         Failed - K in normal range and within 180 days    Potassium  Date Value Ref Range Status  09/19/2023 3.9 3.5 - 5.2 mmol/L Final         Failed - Na in normal range and within 180 days    Sodium  Date Value Ref Range Status  09/19/2023 148 (H) 134 - 144 mmol/L Final         Failed - Last BP in normal range    BP Readings from Last 1 Encounters:  01/15/24 (!) 152/89         Passed - Valid encounter within last 6 months    Recent Outpatient Visits           7 months ago Annual physical exam   [facility] at [facility] [place], Leita DEL, MD       Future Appointments             In 1 year Stoioff, Glendia BROCKS, MD [facility] [place]

## 2024-05-20 NOTE — Progress Notes (Signed)
 Pharmacy Quality Measure Review  This patient is appearing on a report for being at risk of failing the adherence measure for cholesterol (statin) medications this calendar year.   Medication: atorvastatin  10 mg Last fill date: 05/13/24 for 90 day supply  Insurance report was not up to date. No action needed at this time.   Jenkins Graces, PharmD PGY1 Pharmacy Resident

## 2024-06-10 ENCOUNTER — Ambulatory Visit: Admitting: Podiatry

## 2024-06-10 ENCOUNTER — Encounter: Payer: Self-pay | Admitting: Podiatry

## 2024-06-10 ENCOUNTER — Ambulatory Visit (INDEPENDENT_AMBULATORY_CARE_PROVIDER_SITE_OTHER)

## 2024-06-10 DIAGNOSIS — M722 Plantar fascial fibromatosis: Secondary | ICD-10-CM

## 2024-06-10 MED ORDER — TRIAMCINOLONE ACETONIDE 40 MG/ML IJ SUSP
20.0000 mg | Freq: Once | INTRAMUSCULAR | Status: AC
  Administered 2024-06-10: 20 mg

## 2024-06-10 MED ORDER — METHYLPREDNISOLONE 4 MG PO TBPK: ORAL_TABLET | ORAL | 0 refills | Status: AC

## 2024-06-10 MED ORDER — MELOXICAM 15 MG PO TABS: 15.0000 mg | ORAL_TABLET | Freq: Every day | ORAL | 0 refills | Status: AC

## 2024-06-10 NOTE — Progress Notes (Signed)
 "  Subjective:  Patient ID: Gabriela Mullins, female    DOB: 1955-11-01,  MRN: [id number] HPI Chief Complaint  Patient presents with   Foot Pain    NP - left heel pain - since September - got oofos for Christmas and those have helped around the house - she has a trip to [place] planned in June    68 y.o. female presents with the above complaint.   ROS: Denies fever chills nausea mobic  muscle aches pains calf pain back pain chest pain shortness of breath.  Past Medical History:  Diagnosis Date   Allergy    See list   Arthritis 03/16/2021   My left hip   Cataract 2021   Complication of anesthesia    BP dropped after hip replacement and gall bladder surgery   Family history of adverse reaction to anesthesia    Mother - slow to wake   Hyperlipidemia    Nephrolithiasis    Osteopenia    PONV (postoperative nausea and vomiting)    Pre-diabetes    Past Surgical History:  Procedure Laterality Date   CATARACT EXTRACTION W/PHACO Left 08/14/2023   Procedure: CATARACT EXTRACTION PHACO AND INTRAOCULAR LENS PLACEMENT (IOC) LEFT  5.60  00:33.4;  Surgeon: Mittie Gaskin, MD;  Location: [facility];  Service: Ophthalmology;  Laterality: Left;   CATARACT EXTRACTION W/PHACO Right 08/28/2023   Procedure: CATARACT EXTRACTION PHACO AND INTRAOCULAR LENS PLACEMENT (IOC) RIGHT 6.47 00:42.9;  Surgeon: Mittie Gaskin, MD;  Location: [facility];  Service: Ophthalmology;  Laterality: Right;   CHOLECYSTECTOMY  1990   EXTRACORPOREAL SHOCK WAVE LITHOTRIPSY     TONSILLECTOMY AND ADENOIDECTOMY     TOTAL HIP ARTHROPLASTY Left 11/15/2022   URETEROLITHOTOMY  1970   WISDOM TOOTH EXTRACTION Bilateral    Current Medications[1]  Allergies[2] Review of Systems Objective:  There were no vitals filed for this visit.  General: Well developed, nourished, in no acute distress, alert and oriented x3   Dermatological: Skin is warm, dry and supple bilateral. Nails x 10 are well maintained;  remaining integument appears unremarkable at this time. There are no open sores, no preulcerative lesions, no rash or signs of infection present.  Vascular: Dorsalis Pedis artery and Posterior Tibial artery pedal pulses are 2/4 bilateral with immedate capillary fill time. Pedal hair growth present. No varicosities and no lower extremity edema present bilateral.   Neruologic: Grossly intact via light touch bilateral. Vibratory intact via tuning fork bilateral. Protective threshold with Semmes Wienstein monofilament intact to all pedal sites bilateral. Patellar and Achilles deep tendon reflexes 2+ bilateral. No Babinski or clonus noted bilateral.   Musculoskeletal: No gross boney pedal deformities bilateral. No pain, crepitus, or limitation noted with foot and ankle range of motion bilateral. Muscular strength 5/5 in all groups tested bilateral.  Pain on palpation medial calcaneal tubercle of the left heel.  Gait: Unassisted, Nonantalgic.    Radiographs:  Radiographs taken today demonstrate osseously mature individual with decreased bone mineralization.  Soft tissue increase in density at the plantar fascial calcaneal insertion site no retrocalcaneal issues.  Assessment & Plan:   Assessment: Plantar fasciitis.  Plan: Injected the toe today 20 mg Kenalog  pentagrams Marcaine to the point of maximal tenderness.  Started her on methylprednisolone  to be followed by meloxicam  and discussed appropriate shoe gear stretching exercises and ice therapy.  I will follow-up with her in 1 month.  Gabriela Mullins, DPM    [1]  Current Outpatient Medications:    meloxicam  (MOBIC ) 15 MG  tablet, Take 1 tablet (15 mg total) by mouth daily., Disp: 30 tablet, Rfl: 0   methylPREDNISolone  (MEDROL  DOSEPAK) 4 MG TBPK tablet, Take 6 tabs the 1st day, take 5 tabs the 2nd day, take 4 tabs the 3rd day, take 3 tabs the 4th day, take 2 tabs the 5th day, and take 1 tab the 6th day, Disp: 21 tablet, Rfl: 0   atorvastatin   (LIPITOR) 10 MG tablet, Take 1 tablet (10 mg total) by mouth daily., Disp: 90 tablet, Rfl: 0   Calcium  Citrate-Vitamin D (CALCIUM  CITRATE + D3) 250-5 MG-MCG TABS, Take by mouth., Disp: , Rfl:    hydrochlorothiazide  (HYDRODIURIL ) 50 MG tablet, Take 1 tablet by mouth once daily, Disp: 90 tablet, Rfl: 0   potassium citrate  (UROCIT-K ) 10 MEQ (1080 MG) SR tablet, Take 1 tablet (10 mEq total) by mouth 2 (two) times daily with a meal., Disp: 180 tablet, Rfl: 0 [2]  Allergies Allergen Reactions   Clindamycin Hives   Levofloxacin Hives   Penicillins Hives   Tetracycline Hives   "

## 2024-07-15 ENCOUNTER — Ambulatory Visit: Admitting: Podiatry

## 2024-09-21 ENCOUNTER — Encounter: Admitting: Student

## 2024-09-22 ENCOUNTER — Encounter: Admitting: Student

## 2025-02-17 ENCOUNTER — Ambulatory Visit

## 2025-02-18 ENCOUNTER — Ambulatory Visit

## 2026-01-14 ENCOUNTER — Ambulatory Visit: Admitting: Urology
